# Patient Record
Sex: Female | Born: 1950 | Race: Black or African American | Hispanic: No | Smoking: Never smoker
Health system: Southern US, Community
[De-identification: ages and names within clinical notes are randomized; demographics above are authoritative.]

## PROBLEM LIST (undated history)

## (undated) DIAGNOSIS — M1990 Unspecified osteoarthritis, unspecified site: Secondary | ICD-10-CM

## (undated) DIAGNOSIS — Z8489 Family history of other specified conditions: Secondary | ICD-10-CM

## (undated) DIAGNOSIS — K219 Gastro-esophageal reflux disease without esophagitis: Secondary | ICD-10-CM

## (undated) DIAGNOSIS — I209 Angina pectoris, unspecified: Secondary | ICD-10-CM

## (undated) DIAGNOSIS — Z9889 Other specified postprocedural states: Secondary | ICD-10-CM

## (undated) DIAGNOSIS — I10 Essential (primary) hypertension: Secondary | ICD-10-CM

## (undated) DIAGNOSIS — R112 Nausea with vomiting, unspecified: Secondary | ICD-10-CM

## (undated) DIAGNOSIS — G43909 Migraine, unspecified, not intractable, without status migrainosus: Secondary | ICD-10-CM

## (undated) DIAGNOSIS — Z972 Presence of dental prosthetic device (complete) (partial): Secondary | ICD-10-CM

## (undated) HISTORY — PX: KNEE ARTHROSCOPY AND ARTHROTOMY: SUR84

## (undated) HISTORY — PX: EYE SURGERY: SHX253

## (undated) HISTORY — PX: ABDOMINAL HYSTERECTOMY: SHX81

## (undated) HISTORY — PX: WRIST SURGERY: SHX841

---

## 2003-07-11 ENCOUNTER — Other Ambulatory Visit: Payer: Self-pay

## 2004-01-28 ENCOUNTER — Emergency Department: Payer: Self-pay | Admitting: Emergency Medicine

## 2004-08-01 ENCOUNTER — Emergency Department: Payer: Self-pay | Admitting: Internal Medicine

## 2004-12-25 ENCOUNTER — Emergency Department: Payer: Self-pay | Admitting: Emergency Medicine

## 2005-02-02 ENCOUNTER — Other Ambulatory Visit: Payer: Self-pay

## 2005-02-02 ENCOUNTER — Emergency Department: Payer: Self-pay | Admitting: Emergency Medicine

## 2005-05-04 ENCOUNTER — Emergency Department: Payer: Self-pay | Admitting: Emergency Medicine

## 2005-12-09 ENCOUNTER — Emergency Department: Payer: Self-pay | Admitting: Emergency Medicine

## 2005-12-09 ENCOUNTER — Other Ambulatory Visit: Payer: Self-pay

## 2005-12-28 ENCOUNTER — Other Ambulatory Visit: Payer: Self-pay

## 2005-12-28 ENCOUNTER — Emergency Department: Payer: Self-pay | Admitting: Emergency Medicine

## 2006-05-21 ENCOUNTER — Ambulatory Visit: Payer: Self-pay | Admitting: Family

## 2006-06-10 ENCOUNTER — Ambulatory Visit: Payer: Self-pay | Admitting: Family

## 2006-10-17 ENCOUNTER — Emergency Department: Payer: Self-pay | Admitting: Emergency Medicine

## 2006-10-17 ENCOUNTER — Other Ambulatory Visit: Payer: Self-pay

## 2007-04-05 DIAGNOSIS — I10 Essential (primary) hypertension: Secondary | ICD-10-CM | POA: Insufficient documentation

## 2007-09-15 ENCOUNTER — Inpatient Hospital Stay: Payer: Self-pay | Admitting: Vascular Surgery

## 2008-03-14 ENCOUNTER — Ambulatory Visit: Payer: Self-pay | Admitting: Emergency Medicine

## 2008-03-22 ENCOUNTER — Ambulatory Visit: Payer: Self-pay | Admitting: Emergency Medicine

## 2008-04-16 ENCOUNTER — Emergency Department: Payer: Self-pay | Admitting: Emergency Medicine

## 2008-05-22 DIAGNOSIS — F411 Generalized anxiety disorder: Secondary | ICD-10-CM | POA: Insufficient documentation

## 2008-05-22 DIAGNOSIS — K219 Gastro-esophageal reflux disease without esophagitis: Secondary | ICD-10-CM | POA: Insufficient documentation

## 2008-09-20 ENCOUNTER — Ambulatory Visit: Payer: Self-pay | Admitting: Emergency Medicine

## 2008-11-27 DIAGNOSIS — E669 Obesity, unspecified: Secondary | ICD-10-CM | POA: Insufficient documentation

## 2008-12-01 ENCOUNTER — Emergency Department: Payer: Self-pay | Admitting: Unknown Physician Specialty

## 2009-02-03 ENCOUNTER — Observation Stay: Payer: Self-pay | Admitting: Internal Medicine

## 2009-05-16 ENCOUNTER — Ambulatory Visit: Payer: Self-pay | Admitting: Emergency Medicine

## 2010-05-19 ENCOUNTER — Ambulatory Visit: Payer: Self-pay | Admitting: Family Medicine

## 2010-05-30 ENCOUNTER — Ambulatory Visit: Payer: Self-pay | Admitting: Family Medicine

## 2010-07-03 ENCOUNTER — Encounter: Payer: Self-pay | Admitting: Family Medicine

## 2010-07-13 ENCOUNTER — Encounter: Payer: Self-pay | Admitting: Family Medicine

## 2010-07-30 ENCOUNTER — Encounter: Payer: Self-pay | Admitting: Family Medicine

## 2010-08-12 ENCOUNTER — Encounter: Payer: Self-pay | Admitting: Family Medicine

## 2010-10-14 ENCOUNTER — Ambulatory Visit: Payer: Self-pay | Admitting: Specialist

## 2010-10-14 DIAGNOSIS — I10 Essential (primary) hypertension: Secondary | ICD-10-CM

## 2010-10-22 ENCOUNTER — Ambulatory Visit: Payer: Self-pay | Admitting: Specialist

## 2011-02-08 ENCOUNTER — Emergency Department: Payer: Self-pay | Admitting: Internal Medicine

## 2011-02-25 ENCOUNTER — Inpatient Hospital Stay: Payer: Self-pay | Admitting: Internal Medicine

## 2012-07-01 ENCOUNTER — Emergency Department: Payer: Self-pay | Admitting: Emergency Medicine

## 2012-07-01 LAB — URINALYSIS, COMPLETE
Blood: NEGATIVE
Glucose,UR: NEGATIVE mg/dL (ref 0–75)
Nitrite: NEGATIVE
Ph: 7 (ref 4.5–8.0)
Protein: 100
Specific Gravity: 1.014 (ref 1.003–1.030)
Squamous Epithelial: 2

## 2012-07-01 LAB — CBC
HCT: 39.3 % (ref 35.0–47.0)
MCV: 91 fL (ref 80–100)
Platelet: 223 10*3/uL (ref 150–440)
RDW: 13.4 % (ref 11.5–14.5)
WBC: 6.3 10*3/uL (ref 3.6–11.0)

## 2012-07-01 LAB — COMPREHENSIVE METABOLIC PANEL
Albumin: 3.8 g/dL (ref 3.4–5.0)
Alkaline Phosphatase: 135 U/L (ref 50–136)
BUN: 13 mg/dL (ref 7–18)
Chloride: 104 mmol/L (ref 98–107)
EGFR (African American): >60
Glucose: 107 mg/dL — ABNORMAL HIGH (ref 65–99)
Potassium: 3.9 mmol/L (ref 3.5–5.1)
SGOT(AST): 23 U/L (ref 15–37)
SGPT (ALT): 30 U/L (ref 12–78)
Sodium: 140 mmol/L (ref 136–145)
Total Protein: 8.3 g/dL — ABNORMAL HIGH (ref 6.4–8.2)

## 2012-08-25 DIAGNOSIS — R7303 Prediabetes: Secondary | ICD-10-CM | POA: Insufficient documentation

## 2012-08-26 DIAGNOSIS — E559 Vitamin D deficiency, unspecified: Secondary | ICD-10-CM | POA: Insufficient documentation

## 2012-09-21 ENCOUNTER — Ambulatory Visit: Payer: Self-pay | Admitting: Family Medicine

## 2012-09-28 ENCOUNTER — Observation Stay: Payer: Self-pay | Admitting: Internal Medicine

## 2012-09-28 LAB — COMPREHENSIVE METABOLIC PANEL
Alkaline Phosphatase: 125 U/L (ref 50–136)
BUN: 17 mg/dL (ref 7–18)
Bilirubin,Total: 0.4 mg/dL (ref 0.2–1.0)
Calcium, Total: 9.1 mg/dL (ref 8.5–10.1)
Creatinine: 0.91 mg/dL (ref 0.60–1.30)
EGFR (African American): >60
EGFR (Non-African Amer.): >60
Glucose: 149 mg/dL — ABNORMAL HIGH (ref 65–99)
Osmolality: 278 (ref 275–301)
SGOT(AST): 22 U/L (ref 15–37)
SGPT (ALT): 21 U/L (ref 12–78)

## 2012-09-28 LAB — CBC
HCT: 38.7 % (ref 35.0–47.0)
HGB: 13.1 g/dL (ref 12.0–16.0)
MCHC: 33.8 g/dL (ref 32.0–36.0)
MCV: 87 fL (ref 80–100)
RBC: 4.43 10*6/uL (ref 3.80–5.20)

## 2012-09-28 LAB — URINALYSIS, COMPLETE
Bilirubin,UR: NEGATIVE
Blood: NEGATIVE
Glucose,UR: NEGATIVE mg/dL (ref 0–75)
Ketone: NEGATIVE
Leukocyte Esterase: NEGATIVE
Nitrite: NEGATIVE
Ph: 7 (ref 4.5–8.0)
RBC,UR: <1 /HPF (ref 0–5)
Squamous Epithelial: 3
WBC UR: 2 /HPF (ref 0–5)

## 2012-09-28 LAB — TROPONIN I
Troponin-I: <0.02 ng/mL
Troponin-I: <0.02 ng/mL
Troponin-I: <0.02 ng/mL

## 2012-09-28 LAB — CK TOTAL AND CKMB (NOT AT ARMC)
CK, Total: 91 U/L (ref 21–215)
CK, Total: 91 U/L (ref 21–215)
CK-MB: 1.8 ng/mL (ref 0.5–3.6)
CK-MB: 1.3 ng/mL (ref 0.5–3.6)

## 2012-09-29 LAB — BASIC METABOLIC PANEL
Anion Gap: 4 — ABNORMAL LOW (ref 7–16)
BUN: 12 mg/dL (ref 7–18)
Creatinine: 0.9 mg/dL (ref 0.60–1.30)
EGFR (African American): >60
EGFR (Non-African Amer.): >60
Glucose: 99 mg/dL (ref 65–99)
Osmolality: 283 (ref 275–301)
Potassium: 3.9 mmol/L (ref 3.5–5.1)
Sodium: 142 mmol/L (ref 136–145)

## 2012-09-29 LAB — LIPID PANEL
Cholesterol: 169 mg/dL (ref 0–200)
Triglycerides: 110 mg/dL (ref 0–200)
VLDL Cholesterol, Calc: 22 mg/dL (ref 5–40)

## 2012-12-12 ENCOUNTER — Emergency Department: Payer: Self-pay | Admitting: Emergency Medicine

## 2013-09-12 DIAGNOSIS — M1710 Unilateral primary osteoarthritis, unspecified knee: Secondary | ICD-10-CM | POA: Insufficient documentation

## 2013-09-26 ENCOUNTER — Other Ambulatory Visit: Payer: Self-pay | Admitting: Orthopedic Surgery

## 2013-09-26 LAB — SYNOVIAL CELL COUNT + DIFF, W/ CRYSTALS
BASOS ABS: 0 %
Eosinophil: 0 %
Lymphocytes: 1 %
Neutrophils: 96 %
Nucleated Cell Count: 10692 /mm3
OTHER CELLS BF: 0 %
Other Mononuclear Cells: 3 %

## 2013-09-30 LAB — BODY FLUID CULTURE

## 2013-10-06 ENCOUNTER — Ambulatory Visit: Payer: Self-pay | Admitting: Orthopedic Surgery

## 2013-10-20 ENCOUNTER — Ambulatory Visit: Payer: Self-pay | Admitting: Orthopedic Surgery

## 2013-11-01 ENCOUNTER — Ambulatory Visit: Payer: Self-pay | Admitting: Orthopedic Surgery

## 2013-11-01 LAB — APTT: Activated PTT: 30.3 secs (ref 23.6–35.9)

## 2013-11-01 LAB — CBC
HCT: 38.8 % (ref 35.0–47.0)
HGB: 12.5 g/dL (ref 12.0–16.0)
MCH: 28.9 pg (ref 26.0–34.0)
MCHC: 32.2 g/dL (ref 32.0–36.0)
MCV: 90 fL (ref 80–100)
PLATELETS: 219 10*3/uL (ref 150–440)
RBC: 4.33 10*6/uL (ref 3.80–5.20)
RDW: 13.3 % (ref 11.5–14.5)
WBC: 7.1 10*3/uL (ref 3.6–11.0)

## 2013-11-01 LAB — BASIC METABOLIC PANEL
ANION GAP: 3 — AB (ref 7–16)
BUN: 13 mg/dL (ref 7–18)
Calcium, Total: 9.1 mg/dL (ref 8.5–10.1)
Chloride: 105 mmol/L (ref 98–107)
Co2: 30 mmol/L (ref 21–32)
Creatinine: 0.98 mg/dL (ref 0.60–1.30)
EGFR (African American): >60
EGFR (Non-African Amer.): >60
GLUCOSE: 114 mg/dL — AB (ref 65–99)
Osmolality: 277 (ref 275–301)
Potassium: 3.4 mmol/L — ABNORMAL LOW (ref 3.5–5.1)
Sodium: 138 mmol/L (ref 136–145)

## 2013-11-01 LAB — URINALYSIS, COMPLETE
Bacteria: NONE SEEN
Bilirubin,UR: NEGATIVE
Blood: NEGATIVE
GLUCOSE, UR: NEGATIVE mg/dL (ref 0–75)
Ketone: NEGATIVE
Leukocyte Esterase: NEGATIVE
Nitrite: NEGATIVE
Ph: 5 (ref 4.5–8.0)
Protein: NEGATIVE
RBC,UR: <1 /HPF (ref 0–5)
SPECIFIC GRAVITY: 1.012 (ref 1.003–1.030)
Squamous Epithelial: 1
WBC UR: <1 /HPF (ref 0–5)

## 2013-11-01 LAB — SEDIMENTATION RATE: ERYTHROCYTE SED RATE: 14 mm/h (ref 0–30)

## 2013-11-01 LAB — PROTIME-INR
INR: 1
PROTHROMBIN TIME: 12.6 s (ref 11.5–14.7)

## 2013-11-01 LAB — MRSA PCR SCREENING

## 2013-11-14 ENCOUNTER — Inpatient Hospital Stay: Payer: Self-pay | Admitting: Orthopedic Surgery

## 2013-11-15 LAB — BASIC METABOLIC PANEL
Anion Gap: 7 (ref 7–16)
BUN: 14 mg/dL (ref 7–18)
CREATININE: 1.06 mg/dL (ref 0.60–1.30)
Calcium, Total: 7.7 mg/dL — ABNORMAL LOW (ref 8.5–10.1)
Chloride: 105 mmol/L (ref 98–107)
Co2: 26 mmol/L (ref 21–32)
GFR CALC NON AF AMER: 56 — AB
Glucose: 126 mg/dL — ABNORMAL HIGH (ref 65–99)
Osmolality: 278 (ref 275–301)
Potassium: 4.1 mmol/L (ref 3.5–5.1)
Sodium: 138 mmol/L (ref 136–145)

## 2013-11-15 LAB — PLATELET COUNT: Platelet: 191 10*3/uL (ref 150–440)

## 2013-11-15 LAB — HEMOGLOBIN: HGB: 11.5 g/dL — ABNORMAL LOW (ref 12.0–16.0)

## 2013-11-16 LAB — BASIC METABOLIC PANEL
ANION GAP: 4 — AB (ref 7–16)
BUN: 12 mg/dL (ref 7–18)
CO2: 28 mmol/L (ref 21–32)
CREATININE: 1.07 mg/dL (ref 0.60–1.30)
Calcium, Total: 7.9 mg/dL — ABNORMAL LOW (ref 8.5–10.1)
Chloride: 104 mmol/L (ref 98–107)
EGFR (African American): >60
GFR CALC NON AF AMER: 55 — AB
Glucose: 125 mg/dL — ABNORMAL HIGH (ref 65–99)
Osmolality: 273 (ref 275–301)
Potassium: 4.3 mmol/L (ref 3.5–5.1)
SODIUM: 136 mmol/L (ref 136–145)

## 2013-11-16 LAB — HEMOGLOBIN: HGB: 10.3 g/dL — ABNORMAL LOW (ref 12.0–16.0)

## 2013-11-16 LAB — PATHOLOGY REPORT

## 2013-11-17 ENCOUNTER — Encounter: Payer: Self-pay | Admitting: Internal Medicine

## 2013-11-17 LAB — HEMOGLOBIN: HGB: 9.3 g/dL — ABNORMAL LOW (ref 12.0–16.0)

## 2013-11-18 LAB — URINALYSIS, COMPLETE
Bacteria: NONE SEEN
Bilirubin,UR: NEGATIVE
Blood: NEGATIVE
Glucose,UR: NEGATIVE mg/dL (ref 0–75)
Ketone: NEGATIVE
Leukocyte Esterase: NEGATIVE
Nitrite: NEGATIVE
Ph: 6 (ref 4.5–8.0)
Protein: NEGATIVE
RBC,UR: NONE SEEN /HPF (ref 0–5)
Specific Gravity: 1.004 (ref 1.003–1.030)
Squamous Epithelial: <1
WBC UR: NONE SEEN /HPF (ref 0–5)

## 2013-11-20 LAB — URINE CULTURE

## 2013-11-23 LAB — URINALYSIS, COMPLETE
BILIRUBIN, UR: NEGATIVE
Bacteria: NONE SEEN
Blood: NEGATIVE
GLUCOSE, UR: NEGATIVE mg/dL (ref 0–75)
KETONE: NEGATIVE
LEUKOCYTE ESTERASE: NEGATIVE
Nitrite: NEGATIVE
PH: 6 (ref 4.5–8.0)
Protein: NEGATIVE
Specific Gravity: 1.008 (ref 1.003–1.030)
Squamous Epithelial: 8
WBC UR: 1 /HPF (ref 0–5)

## 2013-11-25 LAB — URINE CULTURE

## 2013-12-14 ENCOUNTER — Ambulatory Visit: Payer: Self-pay | Admitting: Orthopedic Surgery

## 2013-12-20 DIAGNOSIS — Z9889 Other specified postprocedural states: Secondary | ICD-10-CM | POA: Insufficient documentation

## 2014-08-03 NOTE — Discharge Summary (Signed)
PATIENT NAME:  [name], [name] MR#:  [id number] DATE OF BIRTH:  May 14, 1950  DATE OF ADMISSION:  09/28/2012 DATE OF DISCHARGE:  09/29/2012  ADMISSION DIAGNOSIS: Possible cerebrovascular accident.   DISCHARGE DIAGNOSES: 1.  Lightheaded and nausea possibly secondary to HCTZ versus viral etiology or dehydration.  2.  Malignant hypertension.  3.  Gastroesophageal reflux disease.  4.  Anxiety.   CONSULTANTS: None.   LABORATORY AND DIAGNOSTICS:  At discharge: Sodium 142, potassium 3.9, chloride 109, bicarb 29, BUN 12, creatinine 0.90, glucose 99, LDL 99, VLDL 22, cholesterol 169 and HDL 48. Troponin x 3 were negative.   MRI of the brain showed no acute intracranial hemorrhage or CVA. A 2-D echocardiogram showed a normal ejection fraction of 60% to 65% with mild aortic regurgitation, impaired relaxation LV diastolic dysfunction.   Carotid ultrasound was negative.   HOSPITAL COURSE: This is a 64 year old female who presented with some very mild right-sided weakness, malignant hypertension and lightheadedness. Initially thought to possibly be due to a stroke, For further details, please refer to the H and P.  1.  Lightheadedness with nausea.  Initially thought this could be a TIA or CVA as the patient did have some very minimal right-sided weakness on admission. I think this is actually due to her high blood pressure. She felt that her nausea and lightheadedness came after her primary care physician increased her lisinopril/HCTZ dose. Her MRI is normal. Dopplers are normal. Neurological exam is unremarkable.  As mentioned, her right upper extremity very mild weakness was likely due to accelerated hypertension. Her symptoms have improved.  Will DC the HCTZ and increase the lisinopril.  2.  Malignant hypertension. Improved. The patient will be DC'd on lisinopril as the patient thinks her increase in hypertension meds caused these symptoms and more likely will not take her medications at home as she is on  lisinopril/HCTZ.  3.  GERD.  On PPI.  4.  Anxiety. I wonder if some anxiety is playing a component in her symptoms. She is on bupropion at home. This can be just outpatient as an outpatient by her PCP.   DISCHARGE MEDICATIONS: 1.  Omeprazole 20 mg b.i.d.  2.  Multivitamin 1 tablet daily.  3.  Bupropion 5 mg b.i.d. p.r.n.  4.  Lisinopril 40 mg daily.   DISCHARGE DIET:  Low sodium.   DISCHARGE ACTIVITY: As tolerated.  DISCHARGE FOLLOWUP:  The patient will follow up with [facility] in 3 to 4 days.  The patient is medically stable for discharge.   TIME SPENT:  Approximately 35 minutes.  ____________________________ Donell Beers. Benjie Karvonen, MD spm:sb D: 09/29/2012 12:59:08 ET T: 09/29/2012 14:08:14 ET JOB#: [id number]  cc: Katheren Jimmerson P. Benjie Karvonen, MD, <Dictator> [place] P Salvatore Shear MD ELECTRONICALLY SIGNED 09/29/2012 21:54

## 2014-08-03 NOTE — H&P (Signed)
PATIENT NAME:  [name], [name] MR#:  [id number] DATE OF BIRTH:  03-13-1951  DATE OF ADMISSION:  09/28/2012  PRIMARY CARE PHYSICIAN:  [facility].  CHIEF COMPLAINT:  Dizziness/lightheadedness and nausea.   HISTORY OF PRESENT ILLNESS:  This is a 64 year old female who presents with the above complaint. Over the past 2 days, the patient says that she has not been feeling herself. Her primary care doctor increased her HCTZ/lisinopril on June 10th, and after taking a couple of these pills, she said that she has felt just not herself, very lightheaded. However, today, she felt significantly lightheaded and dizzy. She was unable to even get off the bed, and she had persistent nausea and vomiting. She does not notice any kind of gait instability. In the ER, it is noticed that her right hand is much weaker than the left, and she does say that it feels numb.     REVIEW OF SYSTEMS:   CONSTITUTIONAL: No fever. Positive fatigue, weakness.  EYES:  No blurred or double vision. No glaucoma or cataracts.  EARS, NOSE, THROAT:  No ear pain.  No snoring or postnasal drip.  RESPIRATORY: No cough, wheezing, hemoptysis, COPD or dyspnea.  CARDIOVASCULAR: No chest pain, orthopnea, palpitations, syncope, edema, arrhythmia, dyspnea on exertion.  GASTROINTESTINAL:  Positive nausea, vomiting. No diarrhea, abdominal pain, melena or ulcers.  GENITOURINARY:   No dysuria or hematuria.  ENDOCRINE:  No polyuria or polydipsia.  HEMATOLOGIC AND LYMPHATICS:  No anemia or easy bruising. SKIN: No rashes or lesions.  MUSCULOSKELETAL: No pain in the shoulders or knees. No limited activity.  NEUROLOGIC:  No history of CVA, TIA, or seizures.  PSYCHIATRIC:  Positive anxiety.   PAST MEDICAL HISTORY: 1.  Hypertension.  2.  Migraines.  3.  Some anxiety.  4.  GERD.   MEDICATIONS:   1.  Omeprazole 20 mg b.i.d.  2.  Multivitamin 1 tablet daily.  3.  HCTZ/lisinopril 25/20, 1 tablet daily.  4.  Buspirone 5 mg.   ALLERGIES: TAPE,  CODEINE, PAXIL AND ASPIRIN, WHICH CAUSES HIVES.   PAST SURGICAL HISTORY:  1.  Cholecystectomy.  2.  Appendectomy.   3.  Wrist surgery. 4.  Hysterectomy.  5.  C-section.   FAMILY HISTORY:  Positive for CAD and father had cancer.   SOCIAL HISTORY:   No tobacco, alcohol or drug use.   PHYSICAL EXAMINATION: VITAL SIGNS:  Temperature 97.8, pulse 78, respirations 20, blood pressure 184/77, 97% on room air.  GENERAL:  The patient is alert, oriented. She is very teary.  HEENT:  Head is atraumatic. Pupils are round, reactive. Sclerae anicteric. Mucous membranes are moist.   Oropharynx is clear.  NECK:  Supple without JVD, carotid bruit, enlarged thyroid.  CARDIOVASCULAR: Regular rate and rhythm. No murmurs, gallops or rubs. PMI is not displaced.  LUNGS:  Clear to auscultation bilaterally without crackles, rales, rhonchi or wheezing. Normal percussion.  ABDOMEN: Bowel sounds are positive. Nontender, nondistended. No hepatosplenomegaly. No rebound or guarding.  EXTREMITIES:  No clubbing, cyanosis or edema.  NEUROLOGIC:  Cranial nerves II through XII are intact. There are no focal deficits. Sensation is decreased in the right upper arm and right face, as compared to the left.  DTRs are 2+ bilaterally and symmetrically.  Her strength in the right side is a 4/5 in her upper extremity. Left upper extremity and lower extremities are 5/5.  SKIN:  Without rash or lesions.   DIAGNOSTIC, LABORATORY AND RADIOLOGICAL DATA: Sodium 137, potassium 4.6, chloride 105, bicarb 25, BUN 17,  creatinine 0.91, glucose 149, total protein 7.8, albumin 3.8, bilirubin 0.4 , alk phos 125, AST 22, ALT 21. Troponin less than 0.02. White blood cells 7.8, hemoglobin 13, hematocrit 38.7, platelets are 215. CT of the head shows no acute intracranial hemorrhage or CVA.   EKG: Normal sinus rhythm. No ST elevations or depressions.   ASSESSMENT AND PLAN:  A 64 year old female who presents with feeling dizzy with nausea and vomiting and  right-sided weakness.  1.  Cerebrovascular accident.  The patient will be admitted to telemetry to evaluate for cerebrovascular event, especially in the posterior lobe, given her nausea, vomiting and dizziness. MRI has been ordered, carotid Dopplers and echocardiogram. SHE IS ALLERGIC TO ASPIRIN, so I have started Plavix and statin. Lipid panel will also be checked. We will consult PT, OT and case management.  2.  Malignant hypertension, possibly secondary to #1.   Due to the possibility of problem #1, we will go head and hold her lisinopril/HCTZ and use hydralazine with p.r.n. parameters for systolic blood pressure greater than 190 to allow perfusion to the brain.  3.  Gastroesophageal reflux disease. We will continue PPI.  4.  Anxiety. Continue buspirone.      CODE STATUS:  The patient is full CODE STATUS.   TIME SPENT: Approximately 50 minutes.    ____________________________ Donell Beers. Benjie Karvonen, MD spm:dmm D: 09/28/2012 09:11:29 ET T: 09/28/2012 09:29:32 ET JOB#: [id number]  cc: Jacobie Stamey P. Benjie Karvonen, MD, <Dictator> [place] P Kandas Oliveto MD ELECTRONICALLY SIGNED 09/28/2012 14:52

## 2014-08-04 NOTE — Discharge Summary (Signed)
PATIENT NAMESIBEL, Church MR#:  [id number] DATE OF BIRTH:  1950/11/07  DATE OF ADMISSION:  11/14/2013 DATE OF DISCHARGE:  11/17/2013  ADMITTING DIAGNOSIS: Left knee osteoarthritis.   DISCHARGE DIAGNOSIS: Left knee osteoarthritis.   OPERATION: On 11/14/2013 the patient had a left total knee arthroplasty.   SURGEON: Hessie Knows, M.D.   ASSISTANT: Rachelle Hora.   ANESTHESIA: Spinal.  IMPLANTS USED: Medacta GMK sphere system with a left 3 femur, fixed left tibial baseplate with a 10 mm flex insert, and a size 2 patella.   COMPLICATIONS: None. The patient was stabilized, brought to the orthopedic floor and then brought down to the recovery room.   HISTORY OF PRESENT ILLNESS: The patient is a 64 year old female who presented for severe left knee osteoarthritis. The patient has had symptoms for over year and has been refractory to conservative treatment. The patient has tried Synvisc and cortisone injections with activity modifications.   PHYSICAL EXAMINATION: In general, alert female with no significant discomfort. The patient is ambulating with difficulty involving the left lower extremity. The patient has range of motion of 100 degrees flexion to about minus 10 degrees extension. The patient has full strength. The patient does have discomfort along the medial lateral joint line with positive crepitus and retropatellar discomfort.   HOSPITAL COURSE: After initial admission on November 14, 2013, the patient was brought to the orthopedic floor. On postop day 1, the patient had a hemoglobin of 11.5, which dropped down to around 10.3 on postop day 2. The patient did physical therapy, initially bed to chair and progressed up to ambulating about 25 feet with physical therapy. The patient was having some wheezing and a little bit of coughing. The patient did have a chest x-ray before discharge.   CONDITION AT DISCHARGE: Stable.   DISPOSITION: The patient was sent to rehab.   DISCHARGE INSTRUCTIONS:  The patient will follow up at [facility] in 2 weeks. The patient will do weight bear as tolerated on the effected leg. The patient will elevate her left leg with 1 to 2 pillows and use thigh-high TED hose on both legs. The patient will do incentive spirometer every hour while awake and be encouraged to do cough and deep breathing. The patient will do a regular diet. The patient will use a Polar Care to decrease swelling. The patient will keep her dressing on, try not to get it wet or dirty. The patient will have a dressing change on a p.r.n. basis. The patient or rehab will call the clinic if there is any bright red bleeding, calf pain, bowel or bladder difficulty, or any fever greater than 101.5. The patient will do physical therapy and occupational therapy per protocol. The patient will continue her nasal cannula while her oxygen saturation is below 90 and will discontinue as it improves.   DISCHARGE MEDICATIONS: Prilosec 20 mg 1 capsule b.i.d., lisinopril 40 mg 1 tablet daily, hydrochlorothiazide 25 mg 1 tablet daily, cetirizine 10 mg 1 tablet daily, vitamin D3 1000 international units 1 capsule daily, vitamin B complex 1 tablet daily, amlodipine 5 mg 1 tablet daily, Tylenol 500 mg 1 tablet q. 4 hours as needed for pain or fever greater than 100.4, oxycodone 10 mg 1 tablet q. 12 hours extended release, Nucynta 75 mg 1 tablet q. 4 hours as needed for moderate pain, milk of magnesia 30 mL 2 times a day, Xarelto 10 mg 1 tablet daily for 10 days and then discontinue, Zofran 4 mg 1 tablet q. 4  to 6 hours as needed for nausea, bisacodyl 10 mg per rectally daily as needed for constipation.  ____________________________ Lenna Sciara. Reche Dixon, [place] jtm:sb D: 11/17/2013 07:33:54 ET T: 11/17/2013 08:00:57 ET JOB#: [id number]  cc: J. Reche Dixon, [place], <Dictator> J Amiylah Anastos [facility] ELECTRONICALLY SIGNED 11/20/2013 8:07

## 2014-08-04 NOTE — Op Note (Signed)
PATIENT NAMEZANYA, [name] MR#:  [id number] DATE OF BIRTH:  1950-08-05  DATE OF PROCEDURE:  11/14/2013  PREOPERATIVE DIAGNOSIS: Left knee osteoarthritis.   POSTOPERATIVE DIAGNOSIS: Left knee osteoarthritis.    PROCEDURE: Left total knee replacement.   SURGEON: Laurene Footman, M.D.   ASSISTANT: Rachelle Hora, PA-C.   ANESTHESIA: Spinal.   DESCRIPTION OF PROCEDURE: The patient was brought to the operating room, and after adequate anesthesia was obtained, the left leg was prepped and draped in the usual sterile fashion with a tourniquet applied to the upper thigh. The patient identification and timeout procedures were utilized. The leg was exsanguinated with an Esmarch tourniquet raised to 300 mmHg. A midline skin incision was made, followed by a medial parapatellar arthrotomy. Inspection of the knee revealed significant tricompartmental osteoarthritis, the medial compartment being the most affected. The ACL was excised along with the fat pad and the PCL. The anterior horns of the menisci were also excised. The proximal tibia was exposed, and for application of the Medacta cutting block to the tibia, this was applied, the longitudinal alignment checked, and the proximal tibial cut carried out with the bone removed as 1 block. Next, the femur was denuded of cartilage in the areas where the Medacta femoral cutting block was to rest, the cutting guide applied, and distal femoral cut carried out with pinholes placed distally. The size 3 femoral cutting block was applied; anterior, posterior and chamfer cuts carried out. The tibia was between a 2 or 3 and the 3 gave excellent coverage and was chosen for the final implant. The trial was pinned in place and a central drill hole carried out followed by the keel punch. Trials were placed with a 10 mm insert. The medial side was very stable. The distal femoral drill holes were made followed by the notch cut, the slot cut for the trochlear groove. These trials  were all removed and the patella was cut using the patellar cutting guide, resecting 10 mm. Three drill holes were made and sized to a size 2.   The local anesthetic was infiltrated at this time with Exparel diluted with saline, injected periarticularly, as was a combination of Toradol and morphine. The tourniquet was let down and there was no significant bleeding. The tourniquet was raised and the bony surfaces thoroughly irrigated and dried. The tibial component was cemented into place first with cement. Excess cement removed, tibial insert locked into place, followed by the femoral component. The knee was held in extension and the patellar button clamped into place.   At this point, after the cement had set and the set screw was placed in the poly, the knee was thoroughly irrigated. Excess cement removed. A lateral release was required as there is some lateral subluxation of the patella. The arthrotomy was repaired using a heavy quill with some extra Ethibond to make sure to maintain the patella in appropriate position. The knee was stable in full extension and flexion with excellent stability.   The skin was closed with 2-0 subcutaneous quill followed by skin staples. Xeroform, 4 x 4's, ABD, Webril, Polar Care and Ace wrap. The patient was sent to recovery in stable condition.   ESTIMATED BLOOD LOSS: 50.   COMPLICATIONS: None.   SPECIMEN: Cut ends of bone.  IMPLANTS: Medacta GMK sphere system with a left 3 femur, fixed left tibia baseplate with a 10 mm flex insert, and a size 2 patella.   COMPLICATIONS: None.    ____________________________ Laurene Footman, MD mjm:jr  D: 11/14/2013 13:24:21 ET T: 11/14/2013 13:43:53 ET JOB#: [id number]  cc: Laurene Footman, MD, <Dictator> Laurene Footman MD ELECTRONICALLY SIGNED 11/14/2013 14:59

## 2014-08-04 NOTE — Op Note (Signed)
PATIENT NAMEASUNA, [name]#:  [id number] DATE OF BIRTH:  Sep 04, 1950  DATE OF PROCEDURE:  12/14/2013   PREOPERATIVE DIAGNOSIS: Left knee arthrofibrosis, post total knee.   POSTOPERATIVE DIAGNOSIS: Left knee arthrofibrosis, post total knee.   PROCEDURE: Left knee manipulation.   ANESTHESIA: General.   SURGEON: Laurene Footman, M.D.   DESCRIPTION OF PROCEDURE: The patient was brought to the operating room and, after adequate anesthesia was obtained,  appropriate patient identification and timeout procedures were completed. With the leg held up and flexion at the hip, the knee was at approximately 70 to 75 degrees of flexion and lacked approximately 15 degrees of extension. Gentle pressure was then applied to the proximal tibia, and there was palpable and audible popping of adhesions. Flexion was brought back to 125 degrees. Bringing the leg out into extension, full extension was possible.   The patient was then sent to the recovery room in stable condition.   BLOOD LOSS: None.   SPECIMEN: None.   COMPLICATIONS: None.    ____________________________ Laurene Footman, MD mjm:MT D: 12/14/2013 19:49:41 ET T: 12/15/2013 06:57:48 ET JOB#: [id number]  cc: Laurene Footman, MD, <Dictator> Laurene Footman MD ELECTRONICALLY SIGNED 12/15/2013 8:09

## 2015-02-01 IMAGING — CT CT OF THE LEFT KNEE WITHOUT CONTRAST
3 of 8 series · 11 of 33 positions shown, 13 images · non-contrast
Comparison: MRI dated 10/06/2013

CLINICAL DATA: Progressive left knee pain. Preoperative planning.
Osteoarthritis.

EXAM:
CT OF THE LEFT KNEE WITHOUT CONTRAST
TECHNIQUE: Multidetector CT imaging of the left knee was performed according to
the standard protocol. Multiplanar CT image reconstructions were
also generated.

[Series 4: knee · axial · 0.39mm/px · z∈[+384,+569]mm · 5 of 279 slices shown, 7 images]
[im 47/279  soft-tissue]
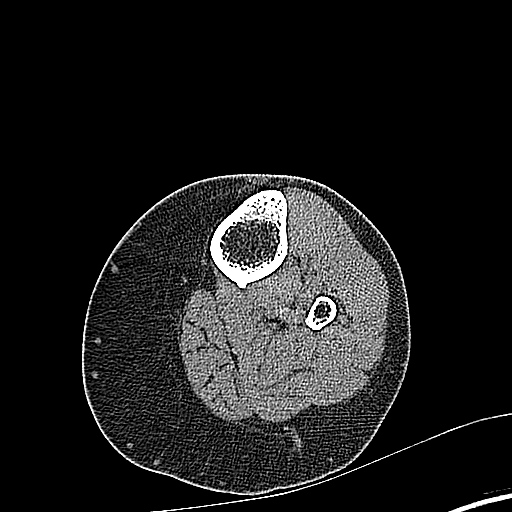
[im 47/279  bone]
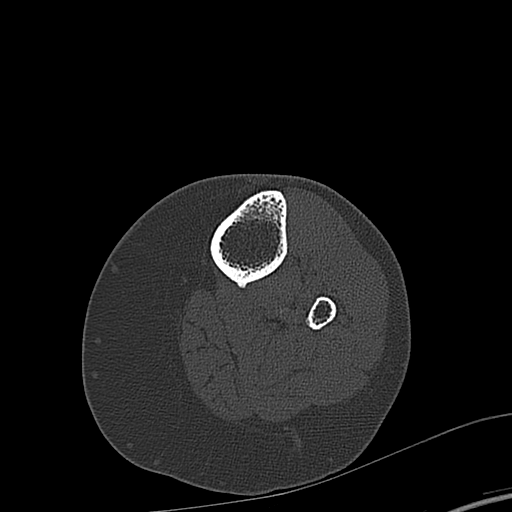
[im 93/279  bone]
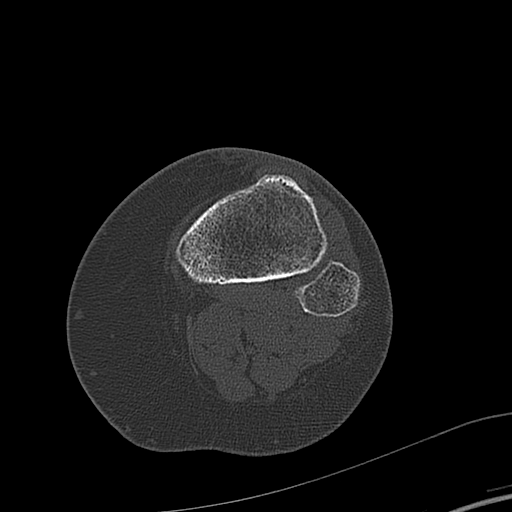
[im 140/279  bone]
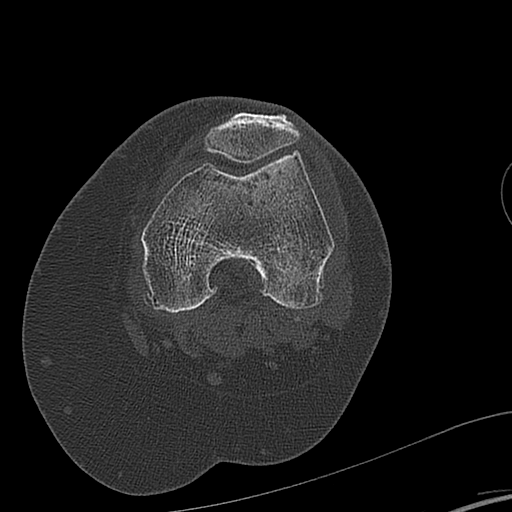
[im 186/279  bone]
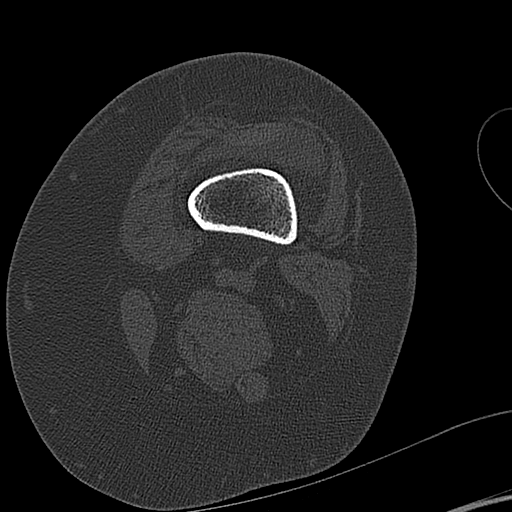
[im 232/279  soft-tissue]
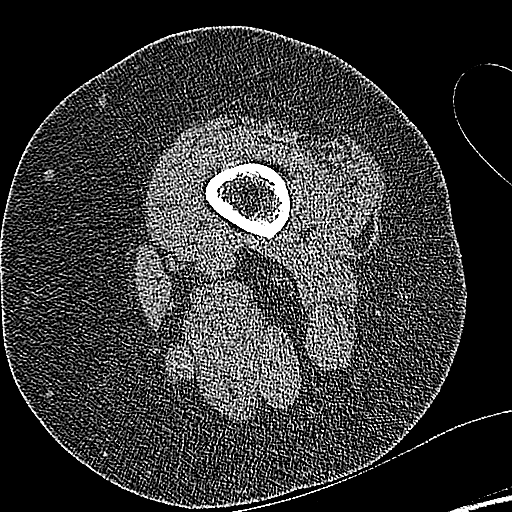
[im 232/279  bone]
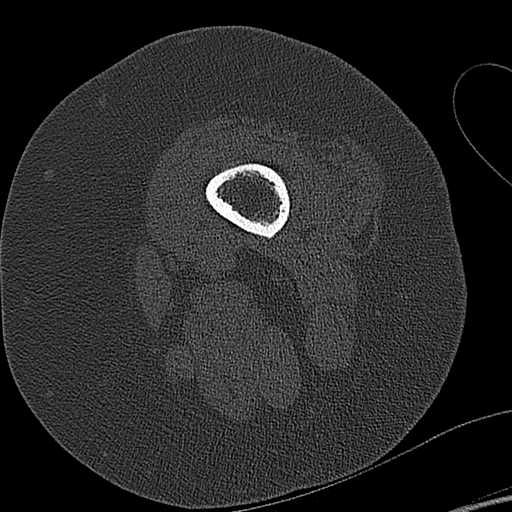

[Series 602: sagittal bone · sagittal · 0.55mm/px · 5 of 48 slices shown]
[im 8/48  bone]
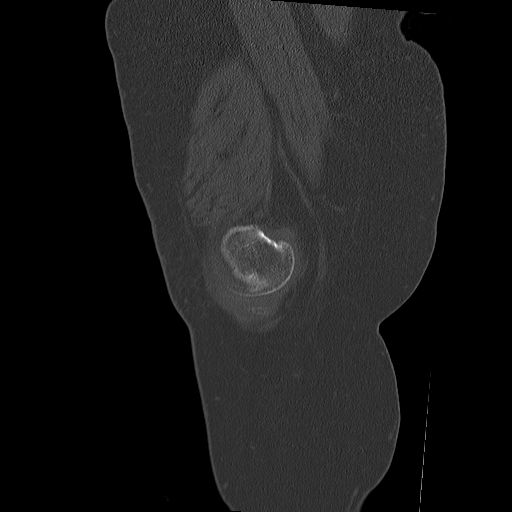
[im 16/48  bone]
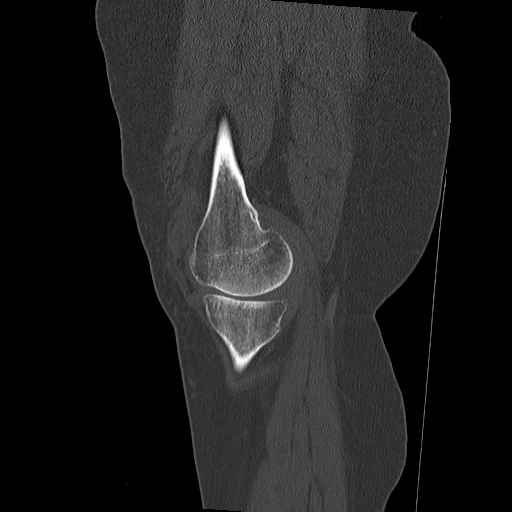
[im 24/48  bone]
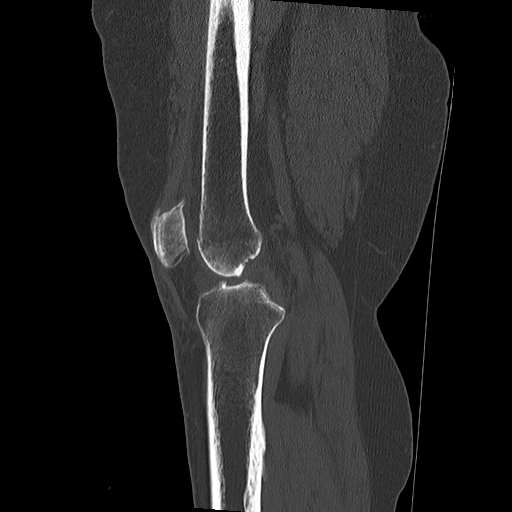
[im 32/48  bone]
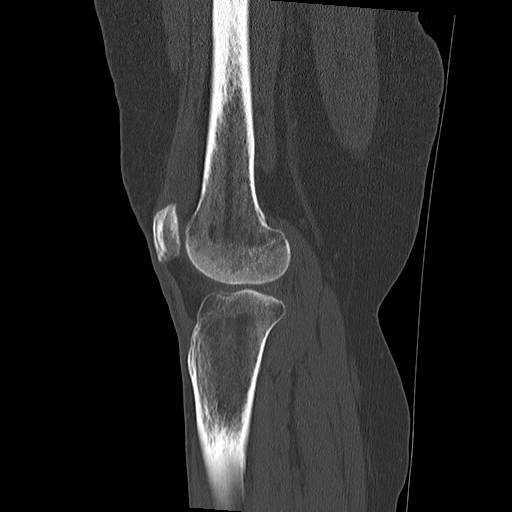
[im 40/48  bone]
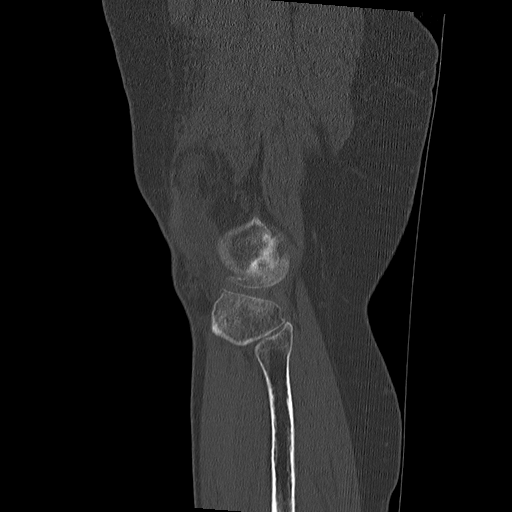

[Series 603: coronal bone · coronal · 0.55mm/px · 1 of 41 slices shown]
[im 21/41  bone]
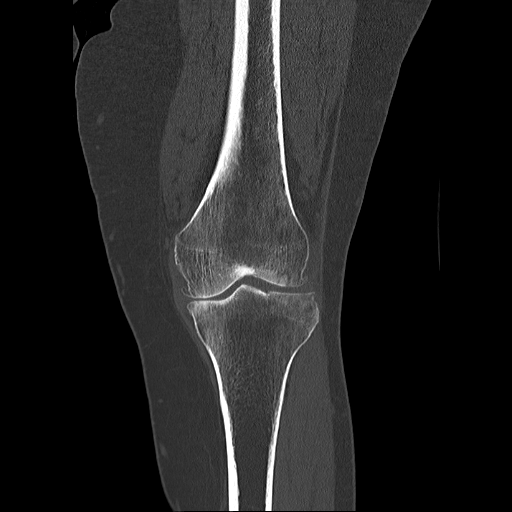

[11 of 33 positions shown; findings below may reference images not displayed]

FINDINGS: Images were obtained through the hip, knee, and ankle for
preoperative planning. There is severe osteoarthritis of the medial
compartment of the left knee with less severe arthritis of the
patellofemoral and lateral compartments. There is medial joint space
narrowing. There is a moderate joint effusion. No calcified loose
bodies in the joint.
IMPRESSION: Tricompartmental osteoarthritis of the left knee.

## 2015-02-21 ENCOUNTER — Encounter: Payer: Self-pay | Admitting: *Deleted

## 2015-02-22 ENCOUNTER — Ambulatory Visit: Payer: Medicare Other | Admitting: Anesthesiology

## 2015-02-22 ENCOUNTER — Ambulatory Visit
Admission: RE | Admit: 2015-02-22 | Discharge: 2015-02-22 | Disposition: A | Discharge status: Home / Self Care | Payer: Medicare Other | Source: Ambulatory Visit | Attending: Gastroenterology | Admitting: Gastroenterology

## 2015-02-22 ENCOUNTER — Encounter
Admission: RE | Disposition: A | Discharge status: Home / Self Care | Payer: Self-pay | Source: Ambulatory Visit | Attending: Gastroenterology

## 2015-02-22 ENCOUNTER — Encounter: Payer: Self-pay | Admitting: *Deleted

## 2015-02-22 DIAGNOSIS — K5730 Diverticulosis of large intestine without perforation or abscess without bleeding: Secondary | ICD-10-CM | POA: Diagnosis not present

## 2015-02-22 DIAGNOSIS — Z6836 Body mass index (BMI) 36.0-36.9, adult: Secondary | ICD-10-CM | POA: Diagnosis not present

## 2015-02-22 DIAGNOSIS — Z79899 Other long term (current) drug therapy: Secondary | ICD-10-CM | POA: Diagnosis not present

## 2015-02-22 DIAGNOSIS — Z1211 Encounter for screening for malignant neoplasm of colon: Secondary | ICD-10-CM | POA: Insufficient documentation

## 2015-02-22 DIAGNOSIS — K219 Gastro-esophageal reflux disease without esophagitis: Secondary | ICD-10-CM | POA: Insufficient documentation

## 2015-02-22 DIAGNOSIS — Z800 Family history of malignant neoplasm of digestive organs: Secondary | ICD-10-CM | POA: Diagnosis not present

## 2015-02-22 DIAGNOSIS — I10 Essential (primary) hypertension: Secondary | ICD-10-CM | POA: Insufficient documentation

## 2015-02-22 DIAGNOSIS — E669 Obesity, unspecified: Secondary | ICD-10-CM | POA: Diagnosis not present

## 2015-02-22 HISTORY — DX: Angina pectoris, unspecified: I20.9

## 2015-02-22 HISTORY — DX: Essential (primary) hypertension: I10

## 2015-02-22 HISTORY — PX: COLONOSCOPY: SHX5424

## 2015-02-22 HISTORY — DX: Gastro-esophageal reflux disease without esophagitis: K21.9

## 2015-02-22 SURGERY — COLONOSCOPY
Anesthesia: General

## 2015-02-22 MED ORDER — PROPOFOL 10 MG/ML IV BOLUS
INTRAVENOUS | Status: DC | PRN
  Administered 2015-02-22: 40 mg via INTRAVENOUS
  Administered 2015-02-22: 30 mg via INTRAVENOUS

## 2015-02-22 MED ORDER — SODIUM CHLORIDE 0.9 % IV SOLN
INTRAVENOUS | Status: DC
  Administered 2015-02-22 (×2): via INTRAVENOUS

## 2015-02-22 MED ORDER — PROPOFOL 500 MG/50ML IV EMUL
INTRAVENOUS | Status: DC | PRN
  Administered 2015-02-22: 125 ug/kg/min via INTRAVENOUS

## 2015-02-22 MED ORDER — FENTANYL CITRATE (PF) 100 MCG/2ML IJ SOLN
INTRAMUSCULAR | Status: DC | PRN
  Administered 2015-02-22: 50 ug via INTRAVENOUS

## 2015-02-22 MED ORDER — MIDAZOLAM HCL 2 MG/2ML IJ SOLN
INTRAMUSCULAR | Status: DC | PRN
  Administered 2015-02-22: 1 mg via INTRAVENOUS

## 2015-02-22 NOTE — Anesthesia Preprocedure Evaluation (Signed)
Anesthesia Evaluation  Patient identified by MRN, date of birth, ID band Patient awake    Reviewed: Allergy & Precautions, NPO status , Patient's Chart, lab work & pertinent test results  Airway Mallampati: II       Dental  (+) Upper Dentures   Pulmonary neg pulmonary ROS,    breath sounds clear to auscultation       Cardiovascular hypertension, Pt. on medications + angina  Rhythm:Regular Rate:Normal     Neuro/Psych    GI/Hepatic Neg liver ROS, GERD  ,  Endo/Other  negative endocrine ROS  Renal/GU negative Renal ROS     Musculoskeletal negative musculoskeletal ROS (+)   Abdominal (+) + obese,   Peds  Hematology negative hematology ROS (+)   Anesthesia Other Findings   Reproductive/Obstetrics                             Anesthesia Physical Anesthesia Plan  ASA: II  Anesthesia Plan: General   Post-op Pain Management:    Induction: Intravenous  Airway Management Planned: Nasal Cannula  Additional Equipment:   Intra-op Plan:   Post-operative Plan:   Informed Consent: I have reviewed the patients History and Physical, chart, labs and discussed the procedure including the risks, benefits and alternatives for the proposed anesthesia with the patient or authorized representative who has indicated his/her understanding and acceptance.     Plan Discussed with: CRNA  Anesthesia Plan Comments:         Anesthesia Quick Evaluation

## 2015-02-22 NOTE — Anesthesia Procedure Notes (Signed)
Date/Time: 02/22/2015 11:17 AM Performed by: Nelda Marseille Pre-anesthesia Checklist: Patient identified, Emergency Drugs available, Suction available, Patient being monitored and Timeout performed Oxygen Delivery Method: Nasal cannula

## 2015-02-22 NOTE — Anesthesia Postprocedure Evaluation (Signed)
  Anesthesia Post-op Note  Patient: [name]  Procedure(s) Performed: Procedure(s): COLONOSCOPY (N/A)  Anesthesia type:General  Patient location: PACU  Post pain: Pain level controlled  Post assessment: Post-op Vital signs reviewed, Patient's Cardiovascular Status Stable, Respiratory Function Stable, Patent Airway and No signs of Nausea or vomiting  Post vital signs: Reviewed and stable  Last Vitals:  Filed Vitals:   02/22/15 1129  BP:   Pulse: 76  Temp: 35.9 C  Resp: 26    Level of consciousness: awake, alert  and patient cooperative  Complications: No apparent anesthesia complications

## 2015-02-22 NOTE — Transfer of Care (Signed)
Immediate Anesthesia Transfer of Care Note  Patient: [name]  Procedure(s) Performed: Procedure(s): COLONOSCOPY (N/A)  Patient Location: PACU  Anesthesia Type:General  Level of Consciousness: awake and oriented  Airway & Oxygen Therapy: Patient Spontanous Breathing and Patient connected to nasal cannula oxygen  Post-op Assessment: Report given to RN and Post -op Vital signs reviewed and stable  Post vital signs: Reviewed and stable  Last Vitals:  Filed Vitals:   02/22/15 0954  BP: 137/79  Pulse: 74  Temp: 36.4 C  Resp: 14    Complications: No apparent anesthesia complications

## 2015-02-22 NOTE — H&P (Signed)
    Primary Care Physician:  No PCP Per Patient Primary Gastroenterologist:  Dr. Candace Cruise  Pre-Procedure History & Physical: HPI:  [name] is a 64 y.o. female is here for an colonoscopy   Past Medical History  Diagnosis Date  . Hypertension   . GERD (gastroesophageal reflux disease)   . Anginal pain [facility])     Past Surgical History  Procedure Laterality Date  . Wrist surgery    . Knee arthroscopy and arthrotomy    . Abdominal hysterectomy    . Cesarean section      Prior to Admission medications   Medication Sig Start Date End Date Taking? Authorizing Provider  hydrochlorothiazide (HYDRODIURIL) 25 MG tablet Take 25 mg by mouth daily.   Yes Historical Provider, MD  lisinopril (PRINIVIL,ZESTRIL) 10 MG tablet Take 10 mg by mouth daily.   Yes Historical Provider, MD  omeprazole (PRILOSEC) 20 MG capsule Take 20 mg by mouth daily.   Yes Historical Provider, MD  polyethylene glycol powder (GLYCOLAX/MIRALAX) powder Take 1 Container by mouth once.   Yes Historical Provider, MD    Allergies as of 01/28/2015  . (Not on File)    History reviewed. No pertinent family history.  Social History   Social History  . Marital Status: Divorced    Spouse Name: N/A  . Number of Children: N/A  . Years of Education: N/A   Occupational History  . Not on file.   Social History Main Topics  . Smoking status: Never Smoker   . Smokeless tobacco: Not on file  . Alcohol Use: Not on file  . Drug Use: Not on file  . Sexual Activity: Not on file   Other Topics Concern  . Not on file   Social History Narrative    Review of Systems: See HPI, otherwise negative ROS  Physical Exam: BP 137/79 mmHg  Pulse 74  Temp(Src) 97.5 F (36.4 C) (Tympanic)  Resp 14  Ht 5\' 3"  (1.6 m)  Wt 94.348 kg (208 lb)  BMI 36.85 kg/m2  SpO2 100% General:   Alert,  pleasant and cooperative in NAD Head:  Normocephalic and atraumatic. Neck:  Supple; no masses or thyromegaly. Lungs:  Clear throughout to  auscultation.    Heart:  Regular rate and rhythm. Abdomen:  Soft, nontender and nondistended. Normal bowel sounds, without guarding, and without rebound.   Neurologic:  Alert and  oriented x4;  grossly normal neurologically.  Impression/Plan: [name] is here for an colonoscopy to be performed for screening, family hx of colon cancer.  Risks, benefits, limitations, and alternatives regarding colonoscopy have been reviewed with the patient.  Questions have been answered.  All parties agreeable.   Cederick Broadnax, Lupita Dawn, MD  02/22/2015, 10:10 AM

## 2015-02-22 NOTE — Op Note (Signed)
[facility] Gastroenterology Patient Name: [name] Procedure Date: 02/22/2015 11:07 AM MRN: [id number] Account #: [id number] Date of Birth: 1951/02/10 Admit Type: Outpatient Age: 64 Room: [facility] ENDO ROOM 4 Gender: Female Note Status: Finalized Procedure:         Colonoscopy Indications:       Screening in patient at increased risk: Family history of                     1st-degree relative with colorectal cancer Providers:         Lupita Dawn. Candace Cruise, MD Referring MD:      Forest Gleason Md, MD (Referring MD) Medicines:         Monitored Anesthesia Care Complications:     No immediate complications. Procedure:         Pre-Anesthesia Assessment:                    - Prior to the procedure, a History and Physical was                     performed, and patient medications, allergies and                     sensitivities were reviewed. The patient's tolerance of                     previous anesthesia was reviewed.                    - The risks and benefits of the procedure and the sedation                     options and risks were discussed with the patient. All                     questions were answered and informed consent was obtained.                    - After reviewing the risks and benefits, the patient was                     deemed in satisfactory condition to undergo the procedure.                    After obtaining informed consent, the colonoscope was                     passed under direct vision. Throughout the procedure, the                     patient's blood pressure, pulse, and oxygen saturations                     were monitored continuously. The Olympus CF-Q160AL                     colonoscope (S#. [id number]) was introduced through the anus                     and advanced to the the cecum, identified by appendiceal                     orifice and ileocecal valve. The colonoscopy was performed  without difficulty. The patient tolerated  the procedure                     well. Findings:      Multiple small and large-mouthed diverticula were found in the sigmoid       colon.      The exam was otherwise without abnormality. Impression:        - Diverticulosis in the sigmoid colon.                    - The examination was otherwise normal.                    - No specimens collected. Recommendation:    - Discharge patient to home.                    - Repeat colonoscopy in 5 years for surveillance.                    - The findings and recommendations were discussed with the                     patient. Procedure Code(s): --- Professional ---                    [id number], Colonoscopy, flexible; diagnostic, including                     collection of specimen(s) by brushing or washing, when                     performed (separate procedure) Diagnosis Code(s): --- Professional ---                    Z80.0, Family history of malignant neoplasm of digestive                     organs                    K57.30, Diverticulosis of large intestine without                     perforation or abscess without bleeding CPT copyright 2014 American Medical Association. All rights reserved. The codes documented in this report are preliminary and upon coder review may  be revised to meet current compliance requirements. Hulen Luster, MD 02/22/2015 11:26:51 AM This report has been signed electronically. Number of Addenda: 0 Note Initiated On: 02/22/2015 11:07 AM Scope Withdrawal Time: 0 hours 2 minutes 22 seconds  Total Procedure Duration: 0 hours 7 minutes 16 seconds       [facility]

## 2015-02-27 ENCOUNTER — Encounter: Payer: Self-pay | Admitting: Gastroenterology

## 2015-03-01 IMAGING — CR DG CHEST 1V PORT
1 series · 1 of 1 positions shown · non-contrast
Comparison: 02/25/2011

CLINICAL DATA: Cough

EXAM:
PORTABLE CHEST - 1 VIEW

[ap]
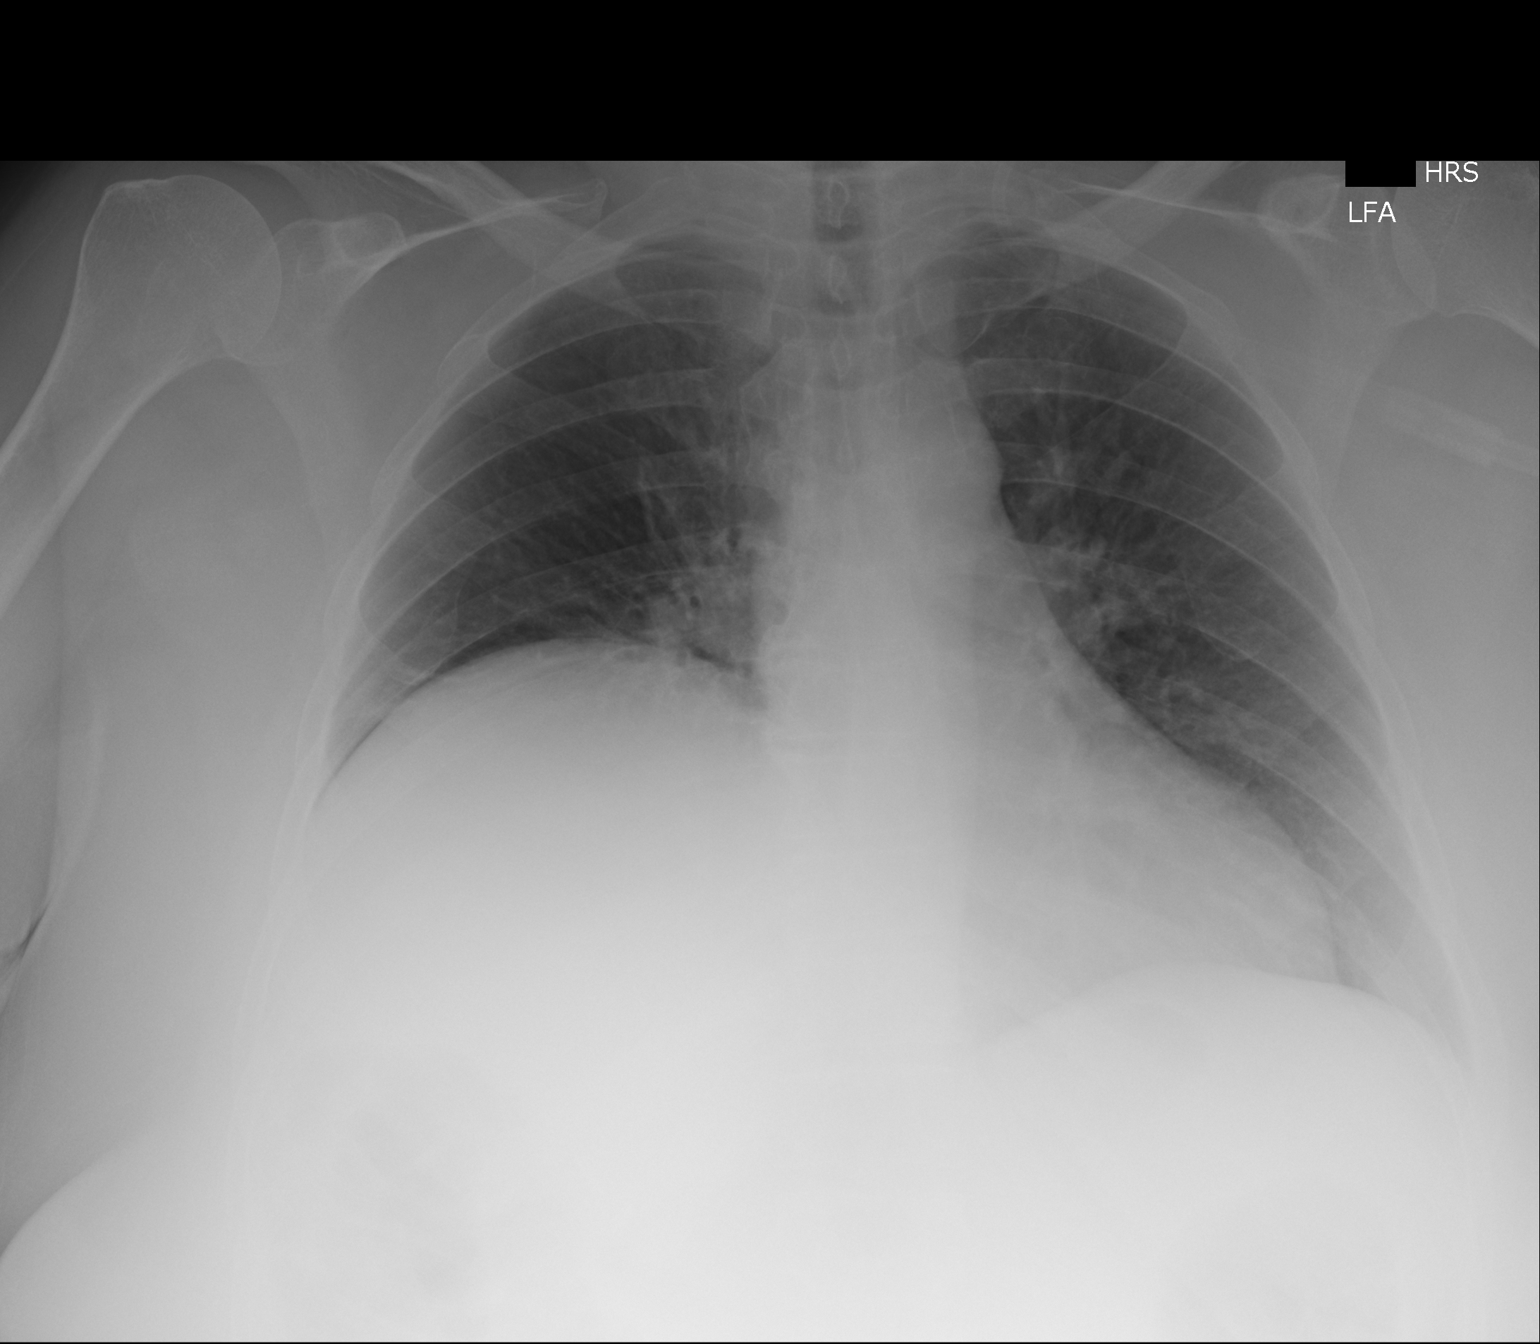

[1 of 1 positions shown; findings below may reference images not displayed]

FINDINGS: Right hemidiaphragm remains elevated with basilar atelectasis. Left
lung is clear. Upper normal heart size. No pneumothorax.
IMPRESSION: No active disease.  Chronic elevation of the right hemidiaphragm.

## 2016-06-03 ENCOUNTER — Other Ambulatory Visit: Payer: Self-pay | Admitting: Family Medicine

## 2016-06-03 DIAGNOSIS — Z1239 Encounter for other screening for malignant neoplasm of breast: Secondary | ICD-10-CM

## 2016-08-13 DIAGNOSIS — E2839 Other primary ovarian failure: Secondary | ICD-10-CM | POA: Insufficient documentation

## 2016-09-24 ENCOUNTER — Other Ambulatory Visit: Payer: Self-pay | Admitting: Obstetrics and Gynecology

## 2016-09-24 DIAGNOSIS — E2839 Other primary ovarian failure: Secondary | ICD-10-CM

## 2016-11-17 ENCOUNTER — Ambulatory Visit
Admission: RE | Admit: 2016-11-17 | Discharge: 2016-11-17 | Disposition: A | Discharge status: Home / Self Care | Payer: Medicare Other | Source: Ambulatory Visit | Attending: Obstetrics and Gynecology | Admitting: Obstetrics and Gynecology

## 2016-11-17 ENCOUNTER — Ambulatory Visit
Admission: RE | Admit: 2016-11-17 | Discharge: 2016-11-17 | Disposition: A | Discharge status: Home / Self Care | Payer: Medicare Other | Source: Ambulatory Visit | Attending: Family Medicine | Admitting: Family Medicine

## 2016-11-17 DIAGNOSIS — E2839 Other primary ovarian failure: Secondary | ICD-10-CM | POA: Diagnosis not present

## 2016-11-17 DIAGNOSIS — Z1231 Encounter for screening mammogram for malignant neoplasm of breast: Secondary | ICD-10-CM | POA: Diagnosis present

## 2016-11-17 DIAGNOSIS — M8588 Other specified disorders of bone density and structure, other site: Secondary | ICD-10-CM | POA: Diagnosis not present

## 2016-11-17 DIAGNOSIS — Z1239 Encounter for other screening for malignant neoplasm of breast: Secondary | ICD-10-CM

## 2017-02-19 ENCOUNTER — Other Ambulatory Visit: Payer: Self-pay

## 2017-02-19 ENCOUNTER — Emergency Department
Admission: EM | Admit: 2017-02-19 | Discharge: 2017-02-19 | Disposition: A | Discharge status: Home / Self Care | Payer: Medicare Other | Attending: Emergency Medicine | Admitting: Emergency Medicine

## 2017-02-19 ENCOUNTER — Emergency Department: Payer: Medicare Other

## 2017-02-19 ENCOUNTER — Encounter: Payer: Self-pay | Admitting: Emergency Medicine

## 2017-02-19 DIAGNOSIS — R197 Diarrhea, unspecified: Secondary | ICD-10-CM | POA: Diagnosis not present

## 2017-02-19 DIAGNOSIS — R531 Weakness: Secondary | ICD-10-CM | POA: Diagnosis not present

## 2017-02-19 DIAGNOSIS — I10 Essential (primary) hypertension: Secondary | ICD-10-CM | POA: Insufficient documentation

## 2017-02-19 DIAGNOSIS — R5383 Other fatigue: Secondary | ICD-10-CM | POA: Insufficient documentation

## 2017-02-19 DIAGNOSIS — Z79899 Other long term (current) drug therapy: Secondary | ICD-10-CM | POA: Insufficient documentation

## 2017-02-19 DIAGNOSIS — R10817 Generalized abdominal tenderness: Secondary | ICD-10-CM | POA: Insufficient documentation

## 2017-02-19 DIAGNOSIS — R5381 Other malaise: Secondary | ICD-10-CM | POA: Diagnosis not present

## 2017-02-19 LAB — URINALYSIS, COMPLETE (UACMP) WITH MICROSCOPIC
Bilirubin Urine: NEGATIVE
Glucose, UA: NEGATIVE mg/dL
HGB URINE DIPSTICK: NEGATIVE
KETONES UR: NEGATIVE mg/dL
LEUKOCYTES UA: NEGATIVE
NITRITE: NEGATIVE
PH: 7 (ref 5.0–8.0)
Protein, ur: NEGATIVE mg/dL
RBC / HPF: NONE SEEN RBC/hpf (ref 0–5)
SPECIFIC GRAVITY, URINE: 1.006 (ref 1.005–1.030)

## 2017-02-19 LAB — BASIC METABOLIC PANEL
ANION GAP: 12 (ref 5–15)
BUN: 36 mg/dL — AB (ref 6–20)
CALCIUM: 9.5 mg/dL (ref 8.9–10.3)
CO2: 24 mmol/L (ref 22–32)
Chloride: 100 mmol/L — ABNORMAL LOW (ref 101–111)
Creatinine, Ser: 1.06 mg/dL — ABNORMAL HIGH (ref 0.44–1.00)
GFR calc Af Amer: >60 mL/min (ref >60)
GFR, EST NON AFRICAN AMERICAN: 53 mL/min — AB (ref >60)
Glucose, Bld: 117 mg/dL — ABNORMAL HIGH (ref 65–99)
POTASSIUM: 4.7 mmol/L (ref 3.5–5.1)
SODIUM: 136 mmol/L (ref 135–145)

## 2017-02-19 LAB — CBC
HCT: 42.1 % (ref 35.0–47.0)
HEMOGLOBIN: 13.7 g/dL (ref 12.0–16.0)
MCH: 29.2 pg (ref 26.0–34.0)
MCHC: 32.6 g/dL (ref 32.0–36.0)
MCV: 89.7 fL (ref 80.0–100.0)
Platelets: 228 10*3/uL (ref 150–440)
RBC: 4.69 MIL/uL (ref 3.80–5.20)
RDW: 13.3 % (ref 11.5–14.5)
WBC: 8.2 10*3/uL (ref 3.6–11.0)

## 2017-02-19 LAB — HEPATIC FUNCTION PANEL
ALT: 20 U/L (ref 14–54)
AST: 18 U/L (ref 15–41)
Albumin: 4.2 g/dL (ref 3.5–5.0)
Alkaline Phosphatase: 103 U/L (ref 38–126)
Bilirubin, Direct: <0.1 mg/dL — ABNORMAL LOW (ref 0.1–0.5)
Total Bilirubin: 0.7 mg/dL (ref 0.3–1.2)
Total Protein: 7.9 g/dL (ref 6.5–8.1)

## 2017-02-19 LAB — LIPASE, BLOOD: Lipase: 24 U/L (ref 11–51)

## 2017-02-19 LAB — TROPONIN I: Troponin I: <0.03 ng/mL (ref <0.03)

## 2017-02-19 MED ORDER — IOPAMIDOL (ISOVUE-300) INJECTION 61%
100.0000 mL | Freq: Once | INTRAVENOUS | Status: AC | PRN
  Administered 2017-02-19: 100 mL via INTRAVENOUS

## 2017-02-19 MED ORDER — FAMOTIDINE 20 MG PO TABS: 20.0000 mg | ORAL_TABLET | Freq: Two times a day (BID) | ORAL | 0 refills | Status: DC

## 2017-02-19 MED ORDER — SODIUM CHLORIDE 0.9 % IV BOLUS (SEPSIS)
1000.0000 mL | Freq: Once | INTRAVENOUS | Status: AC
  Administered 2017-02-19: 1000 mL via INTRAVENOUS

## 2017-02-19 MED ORDER — ONDANSETRON 4 MG PO TBDP: 4.0000 mg | ORAL_TABLET | Freq: Three times a day (TID) | ORAL | 0 refills | Status: DC | PRN

## 2017-02-19 MED ORDER — LOPERAMIDE HCL 2 MG PO TABS: 4.0000 mg | ORAL_TABLET | Freq: Four times a day (QID) | ORAL | 0 refills | Status: DC | PRN

## 2017-02-19 MED ORDER — IOPAMIDOL (ISOVUE-300) INJECTION 61%
30.0000 mL | Freq: Once | INTRAVENOUS | Status: AC | PRN
  Administered 2017-02-19: 30 mL via ORAL

## 2017-02-19 NOTE — ED Triage Notes (Signed)
Pt c/o SHOB starting today. Denies any pain. Has had generalized weakness for last week.  Respirations unlabored. Skin warm and dry.  No fevers.  No cough.  VSS. NAD.

## 2017-02-19 NOTE — ED Notes (Signed)
Patient denies pain and is resting comfortably.  

## 2017-02-19 NOTE — ED Provider Notes (Signed)
[facility] Emergency Department Provider Note  ____________________________________________  Time seen: Approximately 8:36 PM  I have reviewed the triage vital signs and the nursing notes.   HISTORY  Chief Complaint Shortness of Breath    HPI [name] is a 66 y.o. female he complains of generalized weakness and fatigue. She reports that whenever she is up on her feet she just feels tired and wants to rest. Does note some loose watery bowel movements recently. No chest pain or shortness of breath for me. No fevers chills sweats body aches sore throat runny nose cough or congestion. No vomiting, normal oral intake. Denies any focal pain. No aggravating or alleviating factors other than doing more tired when she is up on her feet. Symptoms been constant for approximately one week.     Past Medical History:  Diagnosis Date  . Anginal pain ([place])   . GERD (gastroesophageal reflux disease)   . Hypertension      There are no active problems to display for this patient.    Past Surgical History:  Procedure Laterality Date  . ABDOMINAL HYSTERECTOMY    . CESAREAN SECTION    . KNEE ARTHROSCOPY AND ARTHROTOMY    . WRIST SURGERY       Prior to Admission medications   Medication Sig Start Date End Date Taking? Authorizing Provider  albuterol (VENTOLIN HFA) 108 (90 Base) MCG/ACT inhaler Inhale 2 puffs every 6 (six) hours as needed into the lungs. 02/04/16  Yes [provider]  amitriptyline (ELAVIL) 25 MG tablet Take 1 tablet daily by mouth. 04/25/14  Yes [provider]  amLODipine (NORVASC) 5 MG tablet Take 1 tablet daily by mouth.   Yes [provider]  cetirizine (ZYRTEC) 10 MG tablet Take 1 tablet daily by mouth.   Yes [provider]  hydrochlorothiazide (HYDRODIURIL) 25 MG tablet Take 25 mg by mouth daily.   Yes [provider]  losartan (COZAAR) 100 MG tablet Take 1 tablet daily by mouth. 01/21/17  Yes  [provider]  omeprazole (PRILOSEC) 20 MG capsule Take 20 mg by mouth daily.   Yes [provider]  promethazine (PHENERGAN) 12.5 MG tablet Take 1 tablet every 8 (eight) hours as needed by mouth. 02/10/17  Yes [provider]  polyethylene glycol powder (GLYCOLAX/MIRALAX) powder Take 1 Container by mouth once.    [provider]  predniSONE (DELTASONE) 50 MG tablet Take 1 tablet daily by mouth. 02/10/17   [provider]     Allergies Aspirin; Codeine; Norco [hydrocodone-acetaminophen]; and Percocet [oxycodone-acetaminophen]   Family History  Problem Relation Age of Onset  . Breast cancer Neg Hx     Social History Social History   Tobacco Use  . Smoking status: Never Smoker  . Smokeless tobacco: Never Used  Substance Use Topics  . Alcohol use: Not on file  . Drug use: Not on file    Review of Systems  Constitutional:   No fever or chills.  ENT:   No sore throat. No rhinorrhea. Cardiovascular:   No chest pain or syncope. Respiratory:   No dyspnea or cough. Gastrointestinal:   Negative for abdominal pain, vomiting and diarrhea.  Musculoskeletal:   Negative for focal pain or swelling All other systems reviewed and are negative except as documented above in ROS and HPI.  ____________________________________________   PHYSICAL EXAM:  VITAL SIGNS: ED Triage Vitals  Enc Vitals Group     BP 02/19/17 1126 (!) 152/57     Pulse  Rate 02/19/17 1126 76     Resp 02/19/17 1126 20     Temp 02/19/17 1126 97.7 F (36.5 C)     Temp Source 02/19/17 1126 Oral     SpO2 02/19/17 1126 100 %     Weight 02/19/17 1126 200 lb (90.7 kg)     Height 02/19/17 1126 5\' 3"  (1.6 m)     Head Circumference --      Peak Flow --      Pain Score 02/19/17 1543 8     Pain Loc --      Pain Edu? --      Excl. in [place]? --     Vital signs reviewed, nursing assessments reviewed.   Constitutional:   Alert and oriented. Well appearing and in no  distress. Eyes:   No scleral icterus.  EOMI. No nystagmus. No conjunctival pallor. PERRL. ENT   Head:   Normocephalic and atraumatic.   Nose:   No congestion/rhinnorhea.    Mouth/Throat:   MMM, no pharyngeal erythema. No peritonsillar mass.    Neck:   No meningismus. Full ROM. Hematological/Lymphatic/Immunilogical:   No cervical lymphadenopathy. Cardiovascular:   RRR. Symmetric bilateral radial and DP pulses.  No murmurs.  Respiratory:   Normal respiratory effort without tachypnea/retractions. Breath sounds are clear and equal bilaterally. No wheezes/rales/rhonchi. Gastrointestinal:   Soft with mild generalized tenderness. Non distended. There is no CVA tenderness.  No rebound, rigidity, or guarding. Genitourinary:   deferred Musculoskeletal:   Normal range of motion in all extremities. No joint effusions.  No lower extremity tenderness.  No edema. Neurologic:   Normal speech and language.  Motor grossly intact. No gross focal neurologic deficits are appreciated.  Skin:    Skin is warm, dry and intact. No rash noted.  No petechiae, purpura, or bullae.  ____________________________________________    LABS (pertinent positives/negatives) (all labs ordered are listed, but only abnormal results are displayed) Labs Reviewed  BASIC METABOLIC PANEL - Abnormal; Notable for the following components:      Result Value   Chloride 100 (*)    Glucose, Bld 117 (*)    BUN 36 (*)    Creatinine, Ser 1.06 (*)    GFR calc non Af Amer 53 (*)    All other components within normal limits  URINALYSIS, COMPLETE (UACMP) WITH MICROSCOPIC - Abnormal; Notable for the following components:   Color, Urine YELLOW (*)    APPearance CLEAR (*)    Bacteria, UA RARE (*)    Squamous Epithelial / LPF 0-5 (*)    All other components within normal limits  HEPATIC FUNCTION PANEL - Abnormal; Notable for the following components:   Bilirubin, Direct <0.1 (*)    All other components within normal limits   CBC  TROPONIN I  LIPASE, BLOOD   ____________________________________________   EKG  interpreted by me Normal sinus rhythm rate of 68, normal axis and intervals. Normal QRS ST segments and T waves. Small Q waves present in the lateral leads consistent with prior ischemic event.  ____________________________________________    RADIOLOGY  Dg Chest 2 View  Result Date: 02/19/2017 CLINICAL DATA:  Shortness of breath beginning today, generalized weakness for 1 week, history hypertension and GERD EXAM: CHEST  2 VIEW COMPARISON:  11/17/2013 FINDINGS: Normal heart size, mediastinal contours, and pulmonary vascularity. Mild chronic bronchitic changes. Chronic elevation of RIGHT diaphragm with mild RIGHT basilar atelectasis. No acute infiltrate, pleural effusion or pneumothorax. Bones unremarkable. IMPRESSION: Chronic bronchitic changes and mild RIGHT basilar atelectasis.  No acute abnormalities. Electronically Signed   By: Lavonia Dana M.D.   On: 02/19/2017 12:07   Ct Abdomen Pelvis W Contrast  Result Date: 02/19/2017 CLINICAL DATA:  Diarrhea and generalized weakness EXAM: CT ABDOMEN AND PELVIS WITH CONTRAST TECHNIQUE: Multidetector CT imaging of the abdomen and pelvis was performed using the standard protocol following bolus administration of intravenous contrast. CONTRAST:  114mL ISOVUE-300 IOPAMIDOL (ISOVUE-300) INJECTION 61% COMPARISON:  February 25, 2011 FINDINGS: Lower chest: Coronary artery calcifications. The lung bases are otherwise normal. Hepatobiliary: Hepatic steatosis. Previous cholecystectomy. The liver and portal vein are otherwise normal. Pancreas: Unremarkable. No pancreatic ductal dilatation or surrounding inflammatory changes. Spleen: Normal in size without focal abnormality. Adrenals/Urinary Tract: Adrenal nodularity is stable since 2012, of no acute significance. , the kidneys, and ureters, and bladder are normal. Stomach/Bowel: The stomach and small bowel are normal. Colonic  diverticulosis is seen without diverticulitis. The colon is otherwise normal in appearance. The appendix is not seen but there is no secondary evidence of appendicitis. Vascular/Lymphatic: Scattered atherosclerotic change in the nonaneurysmal aorta. No adenopathy. Reproductive: Status post hysterectomy. No adnexal masses. Other: A fat containing periumbilical hernia is identified. Musculoskeletal: No acute or significant osseous findings. IMPRESSION: 1. No acute abnormality identified. 2. Atherosclerosis in the nonaneurysmal aorta. Coronary artery calcifications. 3. Adrenal hyperplasia/adenomas, unchanged since 2012. 4. Colonic diverticulosis without diverticulitis. 5. The appendix is not seen but there is no secondary evidence of appendicitis. Aortic Atherosclerosis (ICD10-I70.0). Electronically Signed   By: Dorise Bullion III M.D   On: 02/19/2017 17:47    ____________________________________________   PROCEDURES Procedures  ____________________________________________   DIFFERENTIAL DIAGNOSIS  appendicitis, cholecystitis, diverticulitis  CLINICAL IMPRESSION / ASSESSMENT AND PLAN / ED COURSE  Pertinent labs & imaging results that were available during my care of the patient were reviewed by me and considered in my medical decision making (see chart for details).   patient not in distress, but low energy, exam reveals evidence of intra-abdominal process. Symptoms are not focal, exam is not localizing,so proceeded with CT scan for further evaluation. CT is unremarkable. Labs are unremarkable. Patient given IV fluids for hydration, counseled to follow up with primary care. Symptomatically management with Zofran and loperamide and Pepcid.      ____________________________________________   FINAL CLINICAL IMPRESSION(S) / ED DIAGNOSES    Final diagnoses:  Malaise and fatigue      This SmartLink is deprecated. Use AVSMEDLIST instead to display the medication list for a  patient.   Portions of this note were generated with dragon dictation software. Dictation errors may occur despite best attempts at proofreading.    Carrie Mew, MD 02/19/17 2040

## 2017-02-19 NOTE — ED Notes (Signed)
Patient had watery diarrhea stool with recent BM

## 2017-02-19 NOTE — ED Notes (Signed)
Reported to CT that patient completed contrast

## 2018-02-18 DIAGNOSIS — M8580 Other specified disorders of bone density and structure, unspecified site: Secondary | ICD-10-CM | POA: Insufficient documentation

## 2018-02-21 ENCOUNTER — Other Ambulatory Visit: Payer: Self-pay | Admitting: Obstetrics and Gynecology

## 2018-02-21 DIAGNOSIS — Z1231 Encounter for screening mammogram for malignant neoplasm of breast: Secondary | ICD-10-CM

## 2018-02-28 DIAGNOSIS — E785 Hyperlipidemia, unspecified: Secondary | ICD-10-CM | POA: Insufficient documentation

## 2019-01-20 ENCOUNTER — Other Ambulatory Visit: Payer: Self-pay | Admitting: Family Medicine

## 2019-01-20 ENCOUNTER — Ambulatory Visit
Admission: RE | Admit: 2019-01-20 | Discharge: 2019-01-20 | Disposition: A | Discharge status: Home / Self Care | Payer: Medicare Other | Source: Ambulatory Visit | Attending: Family Medicine | Admitting: Family Medicine

## 2019-01-20 DIAGNOSIS — M25551 Pain in right hip: Secondary | ICD-10-CM

## 2019-01-20 DIAGNOSIS — M545 Low back pain, unspecified: Secondary | ICD-10-CM

## 2019-01-20 DIAGNOSIS — M549 Dorsalgia, unspecified: Secondary | ICD-10-CM | POA: Insufficient documentation

## 2019-03-24 DIAGNOSIS — M2021 Hallux rigidus, right foot: Secondary | ICD-10-CM | POA: Insufficient documentation

## 2019-03-29 DIAGNOSIS — R748 Abnormal levels of other serum enzymes: Secondary | ICD-10-CM | POA: Insufficient documentation

## 2019-03-29 DIAGNOSIS — R946 Abnormal results of thyroid function studies: Secondary | ICD-10-CM | POA: Insufficient documentation

## 2019-07-13 ENCOUNTER — Emergency Department
Admission: EM | Admit: 2019-07-13 | Discharge: 2019-07-13 | Disposition: A | Discharge status: Home / Self Care | Payer: Medicare Other | Attending: Emergency Medicine | Admitting: Emergency Medicine

## 2019-07-13 ENCOUNTER — Emergency Department: Payer: Medicare Other

## 2019-07-13 ENCOUNTER — Encounter: Payer: Self-pay | Admitting: Emergency Medicine

## 2019-07-13 ENCOUNTER — Other Ambulatory Visit: Payer: Self-pay

## 2019-07-13 DIAGNOSIS — R200 Anesthesia of skin: Secondary | ICD-10-CM | POA: Diagnosis not present

## 2019-07-13 DIAGNOSIS — I10 Essential (primary) hypertension: Secondary | ICD-10-CM | POA: Diagnosis not present

## 2019-07-13 DIAGNOSIS — R519 Headache, unspecified: Secondary | ICD-10-CM | POA: Diagnosis present

## 2019-07-13 DIAGNOSIS — Z79899 Other long term (current) drug therapy: Secondary | ICD-10-CM | POA: Insufficient documentation

## 2019-07-13 LAB — CBC
HCT: 42.3 % (ref 36.0–46.0)
Hemoglobin: 13.6 g/dL (ref 12.0–15.0)
MCH: 29.8 pg (ref 26.0–34.0)
MCHC: 32.2 g/dL (ref 30.0–36.0)
MCV: 92.8 fL (ref 80.0–100.0)
Platelets: 229 10*3/uL (ref 150–400)
RBC: 4.56 MIL/uL (ref 3.87–5.11)
RDW: 12.9 % (ref 11.5–15.5)
WBC: 14.2 10*3/uL — ABNORMAL HIGH (ref 4.0–10.5)
nRBC: 0 % (ref 0.0–0.2)

## 2019-07-13 LAB — COMPREHENSIVE METABOLIC PANEL
ALT: 21 U/L (ref 0–44)
AST: 15 U/L (ref 15–41)
Albumin: 4.3 g/dL (ref 3.5–5.0)
Alkaline Phosphatase: 107 U/L (ref 38–126)
Anion gap: 11 (ref 5–15)
BUN: 24 mg/dL — ABNORMAL HIGH (ref 8–23)
CO2: 25 mmol/L (ref 22–32)
Calcium: 9.5 mg/dL (ref 8.9–10.3)
Chloride: 101 mmol/L (ref 98–111)
Creatinine, Ser: 1.05 mg/dL — ABNORMAL HIGH (ref 0.44–1.00)
GFR calc Af Amer: >60 mL/min (ref >60)
GFR calc non Af Amer: 54 mL/min — ABNORMAL LOW (ref >60)
Glucose, Bld: 130 mg/dL — ABNORMAL HIGH (ref 70–99)
Potassium: 4.6 mmol/L (ref 3.5–5.1)
Sodium: 137 mmol/L (ref 135–145)
Total Bilirubin: 0.7 mg/dL (ref 0.3–1.2)
Total Protein: 8 g/dL (ref 6.5–8.1)

## 2019-07-13 LAB — LIPASE, BLOOD: Lipase: 26 U/L (ref 11–51)

## 2019-07-13 MED ORDER — SODIUM CHLORIDE 0.9% FLUSH: 3.0000 mL | Freq: Once | INTRAVENOUS | Status: DC

## 2019-07-13 MED ORDER — BUTALBITAL-APAP-CAFFEINE 50-325-40 MG PO TABS
2.0000 | ORAL_TABLET | Freq: Once | ORAL | Status: AC
  Administered 2019-07-13: 2 via ORAL
  Filled 2019-07-13: qty 2

## 2019-07-13 MED ORDER — DIPHENHYDRAMINE HCL 50 MG/ML IJ SOLN
25.0000 mg | Freq: Once | INTRAMUSCULAR | Status: AC
  Administered 2019-07-13: 25 mg via INTRAVENOUS
  Filled 2019-07-13: qty 1

## 2019-07-13 MED ORDER — BUTALBITAL-APAP-CAFFEINE 50-325-40 MG PO TABS: 1.0000 | ORAL_TABLET | Freq: Four times a day (QID) | ORAL | 0 refills | Status: AC | PRN

## 2019-07-13 MED ORDER — SODIUM CHLORIDE 0.9 % IV BOLUS
1000.0000 mL | Freq: Once | INTRAVENOUS | Status: AC
  Administered 2019-07-13: 1000 mL via INTRAVENOUS

## 2019-07-13 MED ORDER — METOCLOPRAMIDE HCL 5 MG/ML IJ SOLN
10.0000 mg | Freq: Once | INTRAMUSCULAR | Status: AC
  Administered 2019-07-13: 10 mg via INTRAVENOUS
  Filled 2019-07-13: qty 2

## 2019-07-13 NOTE — ED Triage Notes (Signed)
C/O Headache and vomiting x 1 day.  STates unable to tolerate any po.  C/O headache to left side forehead and ear.  AAOx3.  Skin warm and dry. NAD

## 2019-07-13 NOTE — ED Provider Notes (Signed)
[facility] Emergency Department Provider Note ____________________________________________   First MD Initiated Contact with Patient 07/13/19 1636     (approximate)  I have reviewed the triage vital signs and the nursing notes.   HISTORY  Chief Complaint Emesis and Headache    HPI [name] is a 69 y.o. female with PMH as noted below who presents with a headache, bilateral but somewhat worse on the left, gradual onset over the last 2 to 3 days, and radiating down to her face, jaw, and anterior neck.  She states it hurts somewhat to turn her head, but denies any stiffness in the neck.  She has no fever or chills.  She reports nausea and some episodes of vomiting, most recently this morning.  She has mild photophobia.  She denies any recent head trauma.  The patient states that she did have a similar headache once a few years ago and was told it was related to her sinuses.  She has had some rhinorrhea.  Past Medical History:  Diagnosis Date  . Anginal pain ([place])   . GERD (gastroesophageal reflux disease)   . Hypertension     There are no problems to display for this patient.   Past Surgical History:  Procedure Laterality Date  . ABDOMINAL HYSTERECTOMY    . CESAREAN SECTION    . COLONOSCOPY N/A 02/22/2015   Procedure: COLONOSCOPY;  Surgeon: Hulen Luster, MD;  Location: [facility] ENDOSCOPY;  Service: Gastroenterology;  Laterality: N/A;  . KNEE ARTHROSCOPY AND ARTHROTOMY    . WRIST SURGERY      Prior to Admission medications   Medication Sig Start Date End Date Taking? Authorizing Provider  albuterol (VENTOLIN HFA) 108 (90 Base) MCG/ACT inhaler Inhale 2 puffs every 6 (six) hours as needed into the lungs. 02/04/16   [provider]  amitriptyline (ELAVIL) 25 MG tablet Take 1 tablet daily by mouth. 04/25/14   [provider]  amLODipine (NORVASC) 5 MG tablet Take 1 tablet daily by mouth.    [provider]  cetirizine (ZYRTEC) 10 MG  tablet Take 1 tablet daily by mouth.    [provider]  famotidine (PEPCID) 20 MG tablet Take 1 tablet (20 mg total) 2 (two) times daily by mouth. 02/19/17   Carrie Mew, MD  hydrochlorothiazide (HYDRODIURIL) 25 MG tablet Take 25 mg by mouth daily.    [provider]  loperamide (IMODIUM A-D) 2 MG tablet Take 2 tablets (4 mg total) 4 (four) times daily as needed by mouth for diarrhea or loose stools. 02/19/17   Carrie Mew, MD  losartan (COZAAR) 100 MG tablet Take 1 tablet daily by mouth. 01/21/17   [provider]  omeprazole (PRILOSEC) 20 MG capsule Take 20 mg by mouth daily.    [provider]  ondansetron (ZOFRAN ODT) 4 MG disintegrating tablet Take 1 tablet (4 mg total) every 8 (eight) hours as needed by mouth for nausea or vomiting. 02/19/17   Carrie Mew, MD  polyethylene glycol powder [facility]) powder Take 1 Container by mouth once.    [provider]  predniSONE (DELTASONE) 50 MG tablet Take 1 tablet daily by mouth. 02/10/17   [provider]  promethazine (PHENERGAN) 12.5 MG tablet Take 1 tablet every 8 (eight) hours as needed by mouth. 02/10/17   [provider]    Allergies Aspirin, Codeine, Norco [hydrocodone-acetaminophen], and Percocet [oxycodone-acetaminophen]  Family History  Problem Relation Age of Onset  . Breast cancer Neg Hx  Social History Social History   Tobacco Use  . Smoking status: Never Smoker  . Smokeless tobacco: Never Used  Substance Use Topics  . Alcohol use: Not on file  . Drug use: Not on file    Review of Systems  Constitutional: No fever/chills. Eyes: No visual changes. ENT: No sore throat. Cardiovascular: Denies chest pain. Respiratory: Denies shortness of breath. Gastrointestinal: Positive for nausea and vomiting. Genitourinary: Negative for flank pain. Musculoskeletal: Negative for back pain. Skin: Negative for rash. Neurological: Positive for  headache.   ____________________________________________   PHYSICAL EXAM:  VITAL SIGNS: ED Triage Vitals  Enc Vitals Group     BP 07/13/19 1016 (!) 146/61     Pulse Rate 07/13/19 1013 82     Resp 07/13/19 1013 16     Temp 07/13/19 1013 98 F (36.7 C)     Temp Source 07/13/19 1013 Oral     SpO2 07/13/19 1013 100 %     Weight 07/13/19 1010 211 lb (95.7 kg)     Height 07/13/19 1010 5\' 3"  (1.6 m)     Head Circumference --      Peak Flow --      Pain Score 07/13/19 1010 10     Pain Loc --      Pain Edu? --      Excl. in [place]? --     Constitutional: Alert and oriented.  Uncomfortable appearing but in no acute distress. Eyes: Conjunctivae are normal.  Mild photophobia.  EOMI.  PERRLA. Head: Atraumatic. Nose: No congestion/rhinnorhea. Mouth/Throat: Mucous membranes are moist.   Neck: Normal range of motion.  No meningeal signs.  Supple and nontender. Cardiovascular: Normal rate, regular rhythm.  Good peripheral circulation. Respiratory: Normal respiratory effort.  No retractions.  Gastrointestinal: Soft and nontender. No distention.  Genitourinary: No flank tenderness. Musculoskeletal: Extremities warm and well perfused.  Neurologic:  Normal speech and language.  5/5 motor strength and intact sensation to all extremities.  Normal coordination with no ataxia on finger-to-nose.  No pronator drift.  No facial droop. Skin:  Skin is warm and dry. No rash noted. Psychiatric: Mood and affect are normal. Speech and behavior are normal.  ____________________________________________   LABS (all labs ordered are listed, but only abnormal results are displayed)  Labs Reviewed  COMPREHENSIVE METABOLIC PANEL - Abnormal; Notable for the following components:      Result Value   Glucose, Bld 130 (*)    BUN 24 (*)    Creatinine, Ser 1.05 (*)    GFR calc non Af Amer 54 (*)    All other components within normal limits  CBC - Abnormal; Notable for the following components:   WBC 14.2 (*)      All other components within normal limits  LIPASE, BLOOD  URINALYSIS, COMPLETE (UACMP) WITH MICROSCOPIC   ____________________________________________  EKG   ____________________________________________  RADIOLOGY  CT head: No ICH or other acute findings CT maxillofacial: No sinus abnormalities or other acute findings  ____________________________________________   PROCEDURES  Procedure(s) performed: No  Procedures  Critical Care performed: No ____________________________________________   INITIAL IMPRESSION / ASSESSMENT AND PLAN / ED COURSE  Pertinent labs & imaging results that were available during my care of the patient were reviewed by me and considered in my medical decision making (see chart for details).  69 year old female with PMH as noted above presents with gradual onset of headache over the last 2 days associated with nausea and vomiting.  The headache radiates down towards her face  and anterior neck and is associated with some photophobia as well.  She has had this once before and was told it was related to sinuses.  On exam, the patient is uncomfortable but overall well-appearing.  Neurologic exam is normal.  The patient has no meningeal signs.  The physical exam is otherwise unremarkable.  Given the gradual onset of the headache, the fact that she has had it previously, and the normal neurologic exam, overall I suspect sinusitis, migraine, tension headache, or other benign etiology.  There are no clinical findings to suggest intracranial hemorrhage or meningitis.  Given the patient's age, I will obtain a CT head and maxillofacial.  Basic labs are unremarkable except for slightly elevated WBC count which is nonspecific.  I will treat symptomatically with Reglan and Benadryl.  The patient is allergic to NSAIDs.  ----------------------------------------- 5:03 PM on 07/13/2019 -----------------------------------------  The CTs are negative for acute  findings.  Given the gradual onset of the symptoms and reassuring neurologic exam, there is no indication for lumbar puncture or further work-up.  The patient had not yet received her medication at the time of my reassessment.  I signed her out to the oncoming physician Dr. Jimmye Norman.  ____________________________________________   FINAL CLINICAL IMPRESSION(S) / ED DIAGNOSES  Final diagnoses:  Nonintractable headache, unspecified chronicity pattern, unspecified headache type      NEW MEDICATIONS STARTED DURING THIS VISIT:  New Prescriptions   No medications on file     Note:  This document was prepared using Dragon voice recognition software and may include unintentional dictation errors.    Arta Silence, MD 07/13/19 1704

## 2019-07-13 NOTE — ED Notes (Addendum)
Pt appears to be resting comfortably at this time. Respirations even and unlabored 

## 2019-07-13 NOTE — ED Notes (Signed)
E-signature not working at this time. Pt verbalized understanding of D/C instructions, prescriptions and follow up care with no further questions at this time. Pt in NAD and ambulatory at time of D/C.  

## 2019-08-30 ENCOUNTER — Other Ambulatory Visit: Payer: Self-pay

## 2019-08-30 ENCOUNTER — Encounter: Payer: Self-pay | Admitting: Ophthalmology

## 2019-09-04 ENCOUNTER — Other Ambulatory Visit
Admission: RE | Admit: 2019-09-04 | Discharge: 2019-09-04 | Disposition: A | Discharge status: Home / Self Care | Payer: Medicare Other | Source: Ambulatory Visit | Attending: Ophthalmology | Admitting: Ophthalmology

## 2019-09-04 ENCOUNTER — Other Ambulatory Visit: Payer: Self-pay

## 2019-09-04 DIAGNOSIS — Z20822 Contact with and (suspected) exposure to covid-19: Secondary | ICD-10-CM | POA: Insufficient documentation

## 2019-09-04 DIAGNOSIS — Z01812 Encounter for preprocedural laboratory examination: Secondary | ICD-10-CM | POA: Diagnosis present

## 2019-09-04 LAB — SARS CORONAVIRUS 2 (TAT 6-24 HRS): SARS Coronavirus 2: NEGATIVE

## 2019-09-04 NOTE — Discharge Instructions (Signed)

## 2019-09-06 ENCOUNTER — Encounter
Admission: RE | Disposition: A | Discharge status: Home / Self Care | Payer: Self-pay | Source: Home / Self Care | Attending: Ophthalmology

## 2019-09-06 ENCOUNTER — Ambulatory Visit: Payer: Medicare Other | Admitting: Anesthesiology

## 2019-09-06 ENCOUNTER — Other Ambulatory Visit: Payer: Self-pay

## 2019-09-06 ENCOUNTER — Encounter: Payer: Self-pay | Admitting: Ophthalmology

## 2019-09-06 ENCOUNTER — Ambulatory Visit
Admission: RE | Admit: 2019-09-06 | Discharge: 2019-09-06 | Disposition: A | Discharge status: Home / Self Care | Payer: Medicare Other | Attending: Ophthalmology | Admitting: Ophthalmology

## 2019-09-06 DIAGNOSIS — K449 Diaphragmatic hernia without obstruction or gangrene: Secondary | ICD-10-CM | POA: Insufficient documentation

## 2019-09-06 DIAGNOSIS — Z886 Allergy status to analgesic agent status: Secondary | ICD-10-CM | POA: Insufficient documentation

## 2019-09-06 DIAGNOSIS — K219 Gastro-esophageal reflux disease without esophagitis: Secondary | ICD-10-CM | POA: Diagnosis not present

## 2019-09-06 DIAGNOSIS — Z79899 Other long term (current) drug therapy: Secondary | ICD-10-CM | POA: Diagnosis not present

## 2019-09-06 DIAGNOSIS — M1990 Unspecified osteoarthritis, unspecified site: Secondary | ICD-10-CM | POA: Insufficient documentation

## 2019-09-06 DIAGNOSIS — M109 Gout, unspecified: Secondary | ICD-10-CM | POA: Diagnosis not present

## 2019-09-06 DIAGNOSIS — F419 Anxiety disorder, unspecified: Secondary | ICD-10-CM | POA: Insufficient documentation

## 2019-09-06 DIAGNOSIS — G43909 Migraine, unspecified, not intractable, without status migrainosus: Secondary | ICD-10-CM | POA: Diagnosis not present

## 2019-09-06 DIAGNOSIS — H2511 Age-related nuclear cataract, right eye: Secondary | ICD-10-CM | POA: Insufficient documentation

## 2019-09-06 DIAGNOSIS — Z885 Allergy status to narcotic agent status: Secondary | ICD-10-CM | POA: Insufficient documentation

## 2019-09-06 DIAGNOSIS — Z87891 Personal history of nicotine dependence: Secondary | ICD-10-CM | POA: Insufficient documentation

## 2019-09-06 DIAGNOSIS — I10 Essential (primary) hypertension: Secondary | ICD-10-CM | POA: Insufficient documentation

## 2019-09-06 HISTORY — DX: Other specified postprocedural states: R11.2

## 2019-09-06 HISTORY — DX: Presence of dental prosthetic device (complete) (partial): Z97.2

## 2019-09-06 HISTORY — DX: Migraine, unspecified, not intractable, without status migrainosus: G43.909

## 2019-09-06 HISTORY — DX: Unspecified osteoarthritis, unspecified site: M19.90

## 2019-09-06 HISTORY — DX: Other specified postprocedural states: Z98.890

## 2019-09-06 HISTORY — PX: CATARACT EXTRACTION W/PHACO: SHX586

## 2019-09-06 HISTORY — DX: Family history of other specified conditions: Z84.89

## 2019-09-06 SURGERY — PHACOEMULSIFICATION, CATARACT, WITH IOL INSERTION
Anesthesia: Monitor Anesthesia Care | Site: Eye | Laterality: Right

## 2019-09-06 MED ORDER — MIDAZOLAM HCL 2 MG/2ML IJ SOLN
INTRAMUSCULAR | Status: DC | PRN
  Administered 2019-09-06: 1 mg via INTRAVENOUS

## 2019-09-06 MED ORDER — TETRACAINE HCL 0.5 % OP SOLN
1.0000 [drp] | OPHTHALMIC | Status: DC | PRN
  Administered 2019-09-06 (×3): 1 [drp] via OPHTHALMIC

## 2019-09-06 MED ORDER — ARMC OPHTHALMIC DILATING DROPS
1.0000 "application " | OPHTHALMIC | Status: DC | PRN
  Administered 2019-09-06 (×3): 1 via OPHTHALMIC

## 2019-09-06 MED ORDER — FENTANYL CITRATE (PF) 100 MCG/2ML IJ SOLN
INTRAMUSCULAR | Status: DC | PRN
  Administered 2019-09-06: 50 ug via INTRAVENOUS

## 2019-09-06 MED ORDER — EPINEPHRINE PF 1 MG/ML IJ SOLN
INTRAOCULAR | Status: DC | PRN
  Administered 2019-09-06: 67 mL via OPHTHALMIC

## 2019-09-06 MED ORDER — TETRACAINE 0.5 % OP SOLN OPTIME - NO CHARGE
OPHTHALMIC | Status: DC | PRN
  Administered 2019-09-06: 2 [drp] via OPHTHALMIC

## 2019-09-06 MED ORDER — PROVISC 10 MG/ML IO SOLN
INTRAOCULAR | Status: DC | PRN
  Administered 2019-09-06: 0.55 mL via INTRAOCULAR

## 2019-09-06 MED ORDER — LIDOCAINE HCL (PF) 2 % IJ SOLN
INTRAOCULAR | Status: DC | PRN
  Administered 2019-09-06: 1 mL via INTRAOCULAR

## 2019-09-06 MED ORDER — BRIMONIDINE TARTRATE-TIMOLOL 0.2-0.5 % OP SOLN
OPHTHALMIC | Status: DC | PRN
  Administered 2019-09-06: 1 [drp] via OPHTHALMIC

## 2019-09-06 MED ORDER — MOXIFLOXACIN HCL 0.5 % OP SOLN
OPHTHALMIC | Status: DC | PRN
  Administered 2019-09-06: 0.2 mL via OPHTHALMIC

## 2019-09-06 MED ORDER — TRYPAN BLUE 0.06 % OP SOLN
OPHTHALMIC | Status: DC | PRN
  Administered 2019-09-06: 0.5 mL via INTRAOCULAR

## 2019-09-06 MED ORDER — ONDANSETRON HCL 4 MG/2ML IJ SOLN: 4.0000 mg | Freq: Once | INTRAMUSCULAR | Status: DC | PRN

## 2019-09-06 MED ORDER — NA CHONDROIT SULF-NA HYALURON 40-17 MG/ML IO SOLN
INTRAOCULAR | Status: DC | PRN
  Administered 2019-09-06: 1 mL via INTRAOCULAR

## 2019-09-06 SURGICAL SUPPLY — 18 items
DISSECTOR HYDRO NUCLEUS 50X22 (MISCELLANEOUS) ×12 IMPLANT
DRSG TEGADERM 2-3/8X2-3/4 SM (GAUZE/BANDAGES/DRESSINGS) ×3 IMPLANT
GLOVE BIOGEL PI IND STRL 8 (GLOVE) ×1 IMPLANT
GLOVE BIOGEL PI INDICATOR 8 (GLOVE) ×2
GOWN STRL REUS W/ TWL LRG LVL3 (GOWN DISPOSABLE) ×1 IMPLANT
GOWN STRL REUS W/ TWL XL LVL3 (GOWN DISPOSABLE) ×1 IMPLANT
GOWN STRL REUS W/TWL LRG LVL3 (GOWN DISPOSABLE) ×2
GOWN STRL REUS W/TWL XL LVL3 (GOWN DISPOSABLE) ×2
KNIFE 45D UP 2.3 (MISCELLANEOUS) ×3 IMPLANT
LENS IOL DIOP 22.5 (Intraocular Lens) ×3 IMPLANT
LENS IOL TECNIS MONO 22.5 (Intraocular Lens) IMPLANT
MARKER SKIN DUAL TIP RULER LAB (MISCELLANEOUS) ×2 IMPLANT
PACK CATARACT (MISCELLANEOUS) ×3 IMPLANT
PACK DR. KING ARMS (PACKS) ×3 IMPLANT
PACK EYE AFTER SURG (MISCELLANEOUS) ×3 IMPLANT
SOLUTION OPHTHALMIC SALT (MISCELLANEOUS) ×3 IMPLANT
WATER STERILE IRR 250ML POUR (IV SOLUTION) ×3 IMPLANT
WIPE NON LINTING 3.25X3.25 (MISCELLANEOUS) ×3 IMPLANT

## 2019-09-06 NOTE — Op Note (Signed)
  PREOPERATIVE DIAGNOSIS:  Nuclear sclerotic cataract of the RIGHT eye.   POSTOPERATIVE DIAGNOSIS:  Nuclear sclerotic cataract of the RIGHT eye.   OPERATIVE PROCEDURE: Cataract surgery OD   SURGEON:  Marchia Meiers, MD.   ANESTHESIA:  Anesthesiologist: Veda Canning, MD CRNA: Cameron Ali, CRNA  1.      Managed anesthesia care. 2.     0.7ml of Shugarcaine was instilled following the paracentesis   COMPLICATIONS:  None.   TECHNIQUE:   Divide and conquer   DESCRIPTION OF PROCEDURE:  The patient was examined and consented in the preoperative holding area where the aforementioned topical anesthesia was applied to the RIGHT eye and then brought back to the Operating Room where the RIGHT eye was prepped and draped in the usual sterile ophthalmic fashion and a lid speculum was placed. A paracentesis was created with the side port blade, the anterior chamber was washed out with trypan blue to stain the anterior capsule, and the anterior chamber was filled with viscoelastic. A near clear corneal incision was performed with the steel keratome. A continuous curvilinear capsulorrhexis was performed with a cystotome followed by the capsulorrhexis forceps. Hydrodissection and hydrodelineation were carried out with BSS on a blunt cannula. The lens was removed in a divide and conquer  technique and the remaining cortical material was removed with the irrigation-aspiration handpiece. The capsular bag was inflated with viscoelastic and the lens was placed in the capsular bag without complication. The remaining viscoelastic was removed from the eye with the irrigation-aspiration handpiece. The wounds were hydrated. The anterior chamber was flushed and the eye was inflated to physiologic pressure. 0.47ml Vigamox was placed in the anterior chamber. The wounds were found to be water tight. The eye was dressed with Vigamox. The patient was given protective glasses to wear throughout the day and a shield with which to  sleep tonight. The patient was also given drops with which to begin a drop regimen today and will follow-up with me in one day. Implant Name Type Inv. Item Serial No. Manufacturer Lot No. LRB No. Used Action  LENS IOL DIOP 22.5 - [id number] Intraocular Lens LENS IOL DIOP 22.5 [id number]  Right 1 Implanted    Procedure(s): CATARACT EXTRACTION PHACO AND INTRAOCULAR LENS PLACEMENT (IOC) RIGHT 5.38  00:34.8 (Right)  Electronically signed: Marchia Meiers 09/06/2019 12:40 PM

## 2019-09-06 NOTE — Anesthesia Procedure Notes (Signed)
Procedure Name: MAC Date/Time: 09/06/2019 12:12 PM Performed by: Cameron Ali, CRNA Pre-anesthesia Checklist: Patient identified, Emergency Drugs available, Suction available, Timeout performed and Patient being monitored Patient Re-evaluated:Patient Re-evaluated prior to induction Oxygen Delivery Method: Nasal cannula Placement Confirmation: positive ETCO2

## 2019-09-06 NOTE — Anesthesia Postprocedure Evaluation (Signed)
Anesthesia Post Note  Patient: [name]  Procedure(s) Performed: CATARACT EXTRACTION PHACO AND INTRAOCULAR LENS PLACEMENT (IOC) RIGHT 5.38  00:34.8 (Right Eye)     Patient location during evaluation: PACU Anesthesia Type: MAC Level of consciousness: awake Pain management: pain level controlled Vital Signs Assessment: post-procedure vital signs reviewed and stable Respiratory status: respiratory function stable Cardiovascular status: stable Postop Assessment: no apparent nausea or vomiting Anesthetic complications: no    Veda Canning

## 2019-09-06 NOTE — H&P (Signed)
   I have reviewed the patient's H&P and agree with its findings. There have been no interval changes.  Jayana Kotula MD Ophthalmology 

## 2019-09-06 NOTE — Transfer of Care (Signed)
Immediate Anesthesia Transfer of Care Note  Patient: [name]  Procedure(s) Performed: CATARACT EXTRACTION PHACO AND INTRAOCULAR LENS PLACEMENT (IOC) RIGHT 5.38  00:34.8 (Right Eye)  Patient Location: PACU  Anesthesia Type: MAC  Level of Consciousness: awake, alert  and patient cooperative  Airway and Oxygen Therapy: Patient Spontanous Breathing and Patient connected to supplemental oxygen  Post-op Assessment: Post-op Vital signs reviewed, Patient's Cardiovascular Status Stable, Respiratory Function Stable, Patent Airway and No signs of Nausea or vomiting  Post-op Vital Signs: Reviewed and stable  Complications: No apparent anesthesia complications

## 2019-09-06 NOTE — Anesthesia Preprocedure Evaluation (Signed)
Anesthesia Evaluation  Patient identified by MRN, date of birth, ID band Patient awake    Reviewed: Allergy & Precautions, NPO status , Patient's Chart, lab work & pertinent test results  History of Anesthesia Complications (+) PONV and history of anesthetic complications  Airway Mallampati: II  TM Distance: >3 FB     Dental   Pulmonary    breath sounds clear to auscultation       Cardiovascular hypertension,  Rhythm:Regular Rate:Normal     Neuro/Psych  Headaches,    GI/Hepatic GERD  ,  Endo/Other    Renal/GU      Musculoskeletal  (+) Arthritis ,   Abdominal   Peds  Hematology   Anesthesia Other Findings   Reproductive/Obstetrics                             Anesthesia Physical Anesthesia Plan  ASA: II  Anesthesia Plan: MAC   Post-op Pain Management:    Induction: Intravenous  PONV Risk Score and Plan: TIVA, Midazolam and Treatment may vary due to age or medical condition  Airway Management Planned: Natural Airway and Nasal Cannula  Additional Equipment:   Intra-op Plan:   Post-operative Plan:   Informed Consent: I have reviewed the patients History and Physical, chart, labs and discussed the procedure including the risks, benefits and alternatives for the proposed anesthesia with the patient or authorized representative who has indicated his/her understanding and acceptance.       Plan Discussed with: CRNA  Anesthesia Plan Comments:         Anesthesia Quick Evaluation

## 2019-09-07 ENCOUNTER — Encounter: Payer: Self-pay | Admitting: *Deleted

## 2019-09-21 ENCOUNTER — Other Ambulatory Visit: Payer: Self-pay

## 2019-09-21 ENCOUNTER — Encounter: Payer: Self-pay | Admitting: Ophthalmology

## 2019-09-26 ENCOUNTER — Other Ambulatory Visit
Admission: RE | Admit: 2019-09-26 | Discharge: 2019-09-26 | Disposition: A | Discharge status: Home / Self Care | Payer: Medicare Other | Source: Ambulatory Visit | Attending: Ophthalmology | Admitting: Ophthalmology

## 2019-09-26 ENCOUNTER — Other Ambulatory Visit: Payer: Self-pay

## 2019-09-26 DIAGNOSIS — Z20822 Contact with and (suspected) exposure to covid-19: Secondary | ICD-10-CM | POA: Insufficient documentation

## 2019-09-26 DIAGNOSIS — Z01812 Encounter for preprocedural laboratory examination: Secondary | ICD-10-CM | POA: Insufficient documentation

## 2019-09-26 LAB — SARS CORONAVIRUS 2 (TAT 6-24 HRS): SARS Coronavirus 2: NEGATIVE

## 2019-09-26 NOTE — Discharge Instructions (Signed)

## 2019-09-28 ENCOUNTER — Ambulatory Visit: Payer: Medicare Other | Admitting: Anesthesiology

## 2019-09-28 ENCOUNTER — Ambulatory Visit
Admission: RE | Admit: 2019-09-28 | Discharge: 2019-09-28 | Disposition: A | Discharge status: Home / Self Care | Payer: Medicare Other | Source: Ambulatory Visit | Attending: Ophthalmology | Admitting: Ophthalmology

## 2019-09-28 ENCOUNTER — Encounter: Payer: Self-pay | Admitting: Ophthalmology

## 2019-09-28 ENCOUNTER — Other Ambulatory Visit: Payer: Self-pay

## 2019-09-28 ENCOUNTER — Encounter
Admission: RE | Disposition: A | Discharge status: Home / Self Care | Payer: Self-pay | Source: Ambulatory Visit | Attending: Ophthalmology

## 2019-09-28 DIAGNOSIS — M1990 Unspecified osteoarthritis, unspecified site: Secondary | ICD-10-CM | POA: Insufficient documentation

## 2019-09-28 DIAGNOSIS — Z79899 Other long term (current) drug therapy: Secondary | ICD-10-CM | POA: Diagnosis not present

## 2019-09-28 DIAGNOSIS — K219 Gastro-esophageal reflux disease without esophagitis: Secondary | ICD-10-CM | POA: Diagnosis not present

## 2019-09-28 DIAGNOSIS — Z886 Allergy status to analgesic agent status: Secondary | ICD-10-CM | POA: Diagnosis not present

## 2019-09-28 DIAGNOSIS — Z87891 Personal history of nicotine dependence: Secondary | ICD-10-CM | POA: Insufficient documentation

## 2019-09-28 DIAGNOSIS — H2512 Age-related nuclear cataract, left eye: Secondary | ICD-10-CM | POA: Diagnosis present

## 2019-09-28 DIAGNOSIS — I10 Essential (primary) hypertension: Secondary | ICD-10-CM | POA: Insufficient documentation

## 2019-09-28 DIAGNOSIS — Z885 Allergy status to narcotic agent status: Secondary | ICD-10-CM | POA: Diagnosis not present

## 2019-09-28 HISTORY — PX: CATARACT EXTRACTION W/PHACO: SHX586

## 2019-09-28 SURGERY — PHACOEMULSIFICATION, CATARACT, WITH IOL INSERTION
Anesthesia: Monitor Anesthesia Care | Site: Eye | Laterality: Left

## 2019-09-28 MED ORDER — NA CHONDROIT SULF-NA HYALURON 40-17 MG/ML IO SOLN
INTRAOCULAR | Status: DC | PRN
  Administered 2019-09-28: 1 mL via INTRAOCULAR

## 2019-09-28 MED ORDER — FENTANYL CITRATE (PF) 100 MCG/2ML IJ SOLN
INTRAMUSCULAR | Status: DC | PRN
  Administered 2019-09-28: 50 ug via INTRAVENOUS

## 2019-09-28 MED ORDER — LIDOCAINE HCL (PF) 2 % IJ SOLN
INTRAOCULAR | Status: DC | PRN
  Administered 2019-09-28: 2 mL via INTRAOCULAR

## 2019-09-28 MED ORDER — MOXIFLOXACIN HCL 0.5 % OP SOLN
OPHTHALMIC | Status: DC | PRN
  Administered 2019-09-28: 0.2 mL via OPHTHALMIC

## 2019-09-28 MED ORDER — TETRACAINE 0.5 % OP SOLN OPTIME - NO CHARGE
OPHTHALMIC | Status: DC | PRN
  Administered 2019-09-28: 2 [drp] via OPHTHALMIC

## 2019-09-28 MED ORDER — EPINEPHRINE PF 1 MG/ML IJ SOLN
INTRAOCULAR | Status: DC | PRN
  Administered 2019-09-28: 78 mL via OPHTHALMIC

## 2019-09-28 MED ORDER — TETRACAINE HCL 0.5 % OP SOLN
1.0000 [drp] | OPHTHALMIC | Status: DC | PRN
  Administered 2019-09-28 (×3): 1 [drp] via OPHTHALMIC

## 2019-09-28 MED ORDER — ARMC OPHTHALMIC DILATING DROPS
1.0000 "application " | OPHTHALMIC | Status: DC | PRN
  Administered 2019-09-28 (×3): 1 via OPHTHALMIC

## 2019-09-28 MED ORDER — MIDAZOLAM HCL 2 MG/2ML IJ SOLN
INTRAMUSCULAR | Status: DC | PRN
  Administered 2019-09-28: 1 mg via INTRAVENOUS

## 2019-09-28 MED ORDER — TRYPAN BLUE 0.06 % OP SOLN
OPHTHALMIC | Status: DC | PRN
  Administered 2019-09-28: 0.5 mL via INTRAOCULAR

## 2019-09-28 MED ORDER — BRIMONIDINE TARTRATE-TIMOLOL 0.2-0.5 % OP SOLN
OPHTHALMIC | Status: DC | PRN
  Administered 2019-09-28: 1 [drp] via OPHTHALMIC

## 2019-09-28 MED ORDER — PROVISC 10 MG/ML IO SOLN
INTRAOCULAR | Status: DC | PRN
  Administered 2019-09-28: 0.55 mL via INTRAOCULAR

## 2019-09-28 SURGICAL SUPPLY — 18 items
DISSECTOR HYDRO NUCLEUS 50X22 (MISCELLANEOUS) ×9 IMPLANT
DRSG TEGADERM 2-3/8X2-3/4 SM (GAUZE/BANDAGES/DRESSINGS) ×3 IMPLANT
GLOVE BIOGEL PI IND STRL 8 (GLOVE) ×1 IMPLANT
GLOVE BIOGEL PI INDICATOR 8 (GLOVE) ×2
GOWN STRL REUS W/ TWL LRG LVL3 (GOWN DISPOSABLE) ×1 IMPLANT
GOWN STRL REUS W/ TWL XL LVL3 (GOWN DISPOSABLE) ×1 IMPLANT
GOWN STRL REUS W/TWL LRG LVL3 (GOWN DISPOSABLE) ×2
GOWN STRL REUS W/TWL XL LVL3 (GOWN DISPOSABLE) ×2
KNIFE 45D UP 2.3 (MISCELLANEOUS) ×3 IMPLANT
LENS IOL DIOP 22.5 (Intraocular Lens) ×3 IMPLANT
LENS IOL TECNIS MONO 22.5 (Intraocular Lens) ×1 IMPLANT
MARKER SKIN DUAL TIP RULER LAB (MISCELLANEOUS) ×3 IMPLANT
PACK CATARACT (MISCELLANEOUS) ×3 IMPLANT
PACK DR. KING ARMS (PACKS) ×3 IMPLANT
PACK EYE AFTER SURG (MISCELLANEOUS) ×3 IMPLANT
SOLUTION OPHTHALMIC SALT (MISCELLANEOUS) ×3 IMPLANT
WATER STERILE IRR 250ML POUR (IV SOLUTION) ×3 IMPLANT
WIPE NON LINTING 3.25X3.25 (MISCELLANEOUS) ×3 IMPLANT

## 2019-09-28 NOTE — Anesthesia Preprocedure Evaluation (Signed)
Anesthesia Evaluation  Patient identified by MRN, date of birth, ID band Patient awake    Reviewed: Allergy & Precautions, H&P , NPO status , Patient's Chart, lab work & pertinent test results, reviewed documented beta blocker date and time   History of Anesthesia Complications (+) PONV and history of anesthetic complications  Airway Mallampati: II  TM Distance: >3 FB Neck ROM: full    Dental  (+) Upper Dentures   Pulmonary neg pulmonary ROS,    Pulmonary exam normal breath sounds clear to auscultation       Cardiovascular Exercise Tolerance: Good hypertension,  Rhythm:regular Rate:Normal     Neuro/Psych  Headaches, negative psych ROS   GI/Hepatic Neg liver ROS, GERD  ,  Endo/Other  negative endocrine ROS  Renal/GU negative Renal ROS  negative genitourinary   Musculoskeletal   Abdominal   Peds  Hematology negative hematology ROS (+)   Anesthesia Other Findings   Reproductive/Obstetrics negative OB ROS                             Anesthesia Physical Anesthesia Plan  ASA: II  Anesthesia Plan: MAC   Post-op Pain Management:    Induction:   PONV Risk Score and Plan: 3 and Treatment may vary due to age or medical condition  Airway Management Planned:   Additional Equipment:   Intra-op Plan:   Post-operative Plan:   Informed Consent: I have reviewed the patients History and Physical, chart, labs and discussed the procedure including the risks, benefits and alternatives for the proposed anesthesia with the patient or authorized representative who has indicated his/her understanding and acceptance.     Dental Advisory Given  Plan Discussed with: CRNA  Anesthesia Plan Comments:         Anesthesia Quick Evaluation

## 2019-09-28 NOTE — H&P (Signed)
   I have reviewed the patient's H&P and agree with its findings. There have been no interval changes.  Theora Vankirk MD Ophthalmology 

## 2019-09-28 NOTE — Anesthesia Procedure Notes (Signed)
Procedure Name: Id[place] Performed by: Cameron Ali, CRNA Pre-anesthesia Checklist: Patient identified, Emergency Drugs available, Suction available, Timeout performed and Patient being monitored Patient Re-evaluated:Patient Re-evaluated prior to induction Oxygen Delivery Method: Nasal cannula Placement Confirmation: positive ETCO2

## 2019-09-28 NOTE — Op Note (Signed)
  PREOPERATIVE DIAGNOSIS:  Nuclear sclerotic cataract of the LEFT eye.   POSTOPERATIVE DIAGNOSIS:  Nuclear sclerotic cataract of the LEFT eye.   OPERATIVE PROCEDURE: Cataract surgery OS   SURGEON:  Marchia Meiers, MD.   ANESTHESIA:  Anesthesiologist: Alisa Graff, MD CRNA: Mayme Genta, CRNA; Cameron Ali, CRNA  1.      Managed anesthesia care. 2.     0.52ml of Shugarcaine was instilled following the paracentesis   COMPLICATIONS:  None.   TECHNIQUE:   Divide and conquer   DESCRIPTION OF PROCEDURE:  The patient was examined and consented in the preoperative holding area where the aforementioned topical anesthesia was applied to the LEFT eye and then brought back to the Operating Room where the left eye was prepped and draped in the usual sterile ophthalmic fashion and a lid speculum was placed. A paracentesis was created with the side port blade, the anterior chamber was washed out with trypan blue to stain the anterior capsule, and the anterior chamber was filled with viscoelastic. A near clear corneal incision was performed with the steel keratome. A continuous curvilinear capsulorrhexis was performed with a cystotome followed by the capsulorrhexis forceps. Hydrodissection and hydrodelineation were carried out with BSS on a blunt cannula. The lens was removed in a divide and conquer  technique and the remaining cortical material was removed with the irrigation-aspiration handpiece. The capsular bag was inflated with viscoelastic and the lens was placed in the capsular bag without complication. The remaining viscoelastic was removed from the eye with the irrigation-aspiration handpiece. The wounds were hydrated. The anterior chamber was flushed and the eye was inflated to physiologic pressure. 0.25ml Vigamox was placed in the anterior chamber. The wounds were found to be water tight. The eye was dressed with Vigamox. The patient was given protective glasses to wear throughout the day and a  shield with which to sleep tonight. The patient was also given drops with which to begin a drop regimen today and will follow-up with me in one day. Implant Name Type Inv. Item Serial No. Manufacturer Lot No. LRB No. Used Action  LENS IOL DIOP 22.5 - [id number] Intraocular Lens LENS IOL DIOP 22.5 [id number]  Left 1 Implanted    Procedure(s): CATARACT EXTRACTION PHACO AND INTRAOCULAR LENS PLACEMENT (IOC) LEFT 9.31  00:57.5 (Left)  Electronically signed: Faithlynn Deeley 09/28/2019 10:40 AM

## 2019-09-28 NOTE — Transfer of Care (Signed)
Immediate Anesthesia Transfer of Care Note  Patient: [name]  Procedure(s) Performed: CATARACT EXTRACTION PHACO AND INTRAOCULAR LENS PLACEMENT (IOC) LEFT 9.31  00:57.5 (Left Eye)  Patient Location: PACU  Anesthesia Type: MAC  Level of Consciousness: awake, alert  and patient cooperative  Airway and Oxygen Therapy: Patient Spontanous Breathing and Patient connected to supplemental oxygen  Post-op Assessment: Post-op Vital signs reviewed, Patient's Cardiovascular Status Stable, Respiratory Function Stable, Patent Airway and No signs of Nausea or vomiting  Post-op Vital Signs: Reviewed and stable  Complications: No complications documented.

## 2019-09-28 NOTE — Anesthesia Postprocedure Evaluation (Signed)
Anesthesia Post Note  Patient: [name]  Procedure(s) Performed: CATARACT EXTRACTION PHACO AND INTRAOCULAR LENS PLACEMENT (IOC) LEFT 9.31  00:57.5 (Left Eye)     Patient location during evaluation: PACU Anesthesia Type: MAC Level of consciousness: awake and alert Pain management: pain level controlled Vital Signs Assessment: post-procedure vital signs reviewed and stable Respiratory status: spontaneous breathing, nonlabored ventilation, respiratory function stable and patient connected to nasal cannula oxygen Cardiovascular status: stable and blood pressure returned to baseline Postop Assessment: no apparent nausea or vomiting Anesthetic complications: no   No complications documented.  Alisa Graff

## 2019-09-29 ENCOUNTER — Encounter: Payer: Self-pay | Admitting: Ophthalmology

## 2019-11-17 DIAGNOSIS — E0590 Thyrotoxicosis, unspecified without thyrotoxic crisis or storm: Secondary | ICD-10-CM | POA: Insufficient documentation

## 2020-07-17 ENCOUNTER — Other Ambulatory Visit: Payer: Self-pay | Admitting: Obstetrics and Gynecology

## 2020-07-17 DIAGNOSIS — Z1231 Encounter for screening mammogram for malignant neoplasm of breast: Secondary | ICD-10-CM

## 2020-08-05 ENCOUNTER — Ambulatory Visit: Payer: Medicare Other | Admitting: Gastroenterology

## 2020-08-06 ENCOUNTER — Ambulatory Visit: Payer: Medicare Other | Admitting: Gastroenterology

## 2020-08-08 ENCOUNTER — Ambulatory Visit
Admission: RE | Admit: 2020-08-08 | Discharge: 2020-08-08 | Disposition: A | Discharge status: Home / Self Care | Payer: Medicare HMO | Source: Ambulatory Visit | Attending: Obstetrics and Gynecology | Admitting: Obstetrics and Gynecology

## 2020-08-08 ENCOUNTER — Other Ambulatory Visit: Payer: Self-pay

## 2020-08-08 DIAGNOSIS — Z1231 Encounter for screening mammogram for malignant neoplasm of breast: Secondary | ICD-10-CM | POA: Insufficient documentation

## 2020-08-09 DIAGNOSIS — Z96652 Presence of left artificial knee joint: Secondary | ICD-10-CM | POA: Insufficient documentation

## 2020-08-09 DIAGNOSIS — Z0000 Encounter for general adult medical examination without abnormal findings: Secondary | ICD-10-CM | POA: Insufficient documentation

## 2020-08-09 DIAGNOSIS — I10 Essential (primary) hypertension: Secondary | ICD-10-CM | POA: Insufficient documentation

## 2020-09-05 ENCOUNTER — Other Ambulatory Visit: Payer: Self-pay

## 2020-09-05 ENCOUNTER — Encounter: Payer: Self-pay | Admitting: Gastroenterology

## 2020-09-05 ENCOUNTER — Ambulatory Visit (INDEPENDENT_AMBULATORY_CARE_PROVIDER_SITE_OTHER): Payer: Medicare HMO | Admitting: Gastroenterology

## 2020-09-05 VITALS — BP 155/64 | HR 63 | Ht 63.0 in | Wt 206.4 lb

## 2020-09-05 DIAGNOSIS — Z800 Family history of malignant neoplasm of digestive organs: Secondary | ICD-10-CM

## 2020-09-05 DIAGNOSIS — R109 Unspecified abdominal pain: Secondary | ICD-10-CM | POA: Diagnosis not present

## 2020-09-05 DIAGNOSIS — K219 Gastro-esophageal reflux disease without esophagitis: Secondary | ICD-10-CM

## 2020-09-05 MED ORDER — NA SULFATE-K SULFATE-MG SULF 17.5-3.13-1.6 GM/177ML PO SOLN
1.0000 | Freq: Once | ORAL | 0 refills | Status: AC
Start: 2020-09-05 — End: 2020-09-05

## 2020-09-05 NOTE — Patient Instructions (Signed)
Gastroesophageal Reflux Disease, Adult  Gastroesophageal reflux (GER) happens when acid from the stomach flows up into the tube that connects the mouth and the stomach (esophagus). Normally, food travels down the esophagus and stays in the stomach to be digested. With GER, food and stomach acid sometimes move back up into the esophagus. You may have a disease called gastroesophageal reflux disease (GERD) if the reflux:  Happens often.  Causes frequent or very bad symptoms.  Causes problems such as damage to the esophagus. When this happens, the esophagus becomes sore and swollen. Over time, GERD can make small holes (ulcers) in the lining of the esophagus. What are the causes? This condition is caused by a problem with the muscle between the esophagus and the stomach. When this muscle is weak or not normal, it does not close properly to keep food and acid from coming back up from the stomach. The muscle can be weak because of:  Tobacco use.  Pregnancy.  Having a certain type of hernia (hiatal hernia).  Alcohol use.  Certain foods and drinks, such as coffee, chocolate, onions, and peppermint. What increases the risk?  Being overweight.  Having a disease that affects your connective tissue.  Taking NSAIDs, such a ibuprofen. What are the signs or symptoms?  Heartburn.  Difficult or painful swallowing.  The feeling of having a lump in the throat.  A bitter taste in the mouth.  Bad breath.  Having a lot of saliva.  Having an upset or bloated stomach.  Burping.  Chest pain. Different conditions can cause chest pain. Make sure you see your doctor if you have chest pain.  Shortness of breath or wheezing.  A long-term cough or a cough at night.  Wearing away of the surface of teeth (tooth enamel).  Weight loss. How is this treated?  Making changes to your diet.  Taking medicine.  Having surgery. Treatment will depend on how bad your symptoms are. Follow these  instructions at home: Eating and drinking  Follow a diet as told by your doctor. You may need to avoid foods and drinks such as: ? Coffee and tea, with or without caffeine. ? Drinks that contain alcohol. ? Energy drinks and sports drinks. ? Bubbly (carbonated) drinks or sodas. ? Chocolate and cocoa. ? Peppermint and mint flavorings. ? Garlic and onions. ? Horseradish. ? Spicy and acidic foods. These include peppers, chili powder, curry powder, vinegar, hot sauces, and BBQ sauce. ? Citrus fruit juices and citrus fruits, such as oranges, lemons, and limes. ? Tomato-based foods. These include red sauce, chili, salsa, and pizza with red sauce. ? Fried and fatty foods. These include donuts, french fries, potato chips, and high-fat dressings. ? High-fat meats. These include hot dogs, rib eye steak, sausage, ham, and bacon. ? High-fat dairy items, such as whole milk, butter, and cream cheese.  Eat small meals often. Avoid eating large meals.  Avoid drinking large amounts of liquid with your meals.  Avoid eating meals during the 2-3 hours before bedtime.  Avoid lying down right after you eat.  Do not exercise right after you eat.   Lifestyle  Do not smoke or use any products that contain nicotine or tobacco. If you need help quitting, ask your doctor.  Try to lower your stress. If you need help doing this, ask your doctor.  If you are overweight, lose an amount of weight that is healthy for you. Ask your doctor about a safe weight loss goal.   General instructions  Pay attention to any changes in your symptoms.  Take over-the-counter and prescription medicines only as told by your doctor.  Do not take aspirin, ibuprofen, or other NSAIDs unless your doctor says it is okay.  Wear loose clothes. Do not wear anything tight around your waist.  Raise (elevate) the head of your bed about 6 inches (15 cm). You may need to use a wedge to do this.  Avoid bending over if this makes your  symptoms worse.  Keep all follow-up visits. Contact a doctor if:  You have new symptoms.  You lose weight and you do not know why.  You have trouble swallowing or it hurts to swallow.  You have wheezing or a cough that keeps happening.  You have a hoarse voice.  Your symptoms do not get better with treatment. Get help right away if:  You have sudden pain in your arms, neck, jaw, teeth, or back.  You suddenly feel sweaty, dizzy, or light-headed.  You have chest pain or shortness of breath.  You vomit and the vomit is green, yellow, or black, or it looks like blood or coffee grounds.  You faint.  Your poop (stool) is red, bloody, or black.  You cannot swallow, drink, or eat. These symptoms may represent a serious problem that is an emergency. Do not wait to see if the symptoms will go away. Get medical help right away. Call your local emergency services (911 in the [place] Do not drive yourself to the hospital. Summary  If a person has gastroesophageal reflux disease (GERD), food and stomach acid move back up into the esophagus and cause symptoms or problems such as damage to the esophagus.  Treatment will depend on how bad your symptoms are.  Follow a diet as told by your doctor.  Take all medicines only as told by your doctor. This information is not intended to replace advice given to you by your health care provider. Make sure you discuss any questions you have with your health care provider. Document Revised: 10/09/2019 Document Reviewed: 10/09/2019 Elsevier Patient Education  [place].

## 2020-09-05 NOTE — Progress Notes (Signed)
Jonathon Bellows MD, MRCP(U.K) [address]  [place]  [place], [place] [address]  Main: [phone]  Fax: [phone]   Gastroenterology Consultation  Referring Provider:     Josephine Cables, MD Primary Care Physician:  Rusty Aus, MD Primary Gastroenterologist:  Dr. Jonathon Bellows  Reason for Consultation:     Acid reflux and colon cancer screening        HPI:   [name] is a 70 y.o. y/o female referred for consultation & management  by Dr. Sabra Heck, Christean Grief, MD.    Is here today to see me for colon cancer screening and GERD. She states that she has been having abdominal pain for few months into her abdomen usually after meals lasting the whole day.  Describes it as a cramp.  Nonradiating.  Not relieved after bowel movement.  Not on any NSAID use.  Takes Protonix 20 mg for thing in the morning on empty stomach.  That helps with the reflux.  No unintentional weight loss.  Denies any lower GI symptoms including diarrhea change in bowel movements or rectal bleeding.   She had her last colonoscopy in November 2016 by Dr. Candace Cruise, no polyps were seen but the colonoscopy withdrawal time was less than 3 minutes.  Family history of colon cancer.     Past Medical History:  Diagnosis Date  . Arthritis   . Family history of adverse reaction to anesthesia    Sister - PONV  . GERD (gastroesophageal reflux disease)   . Hypertension   . Migraine headache    had one 07/13/19 after approx 40 years without one  . PONV (postoperative nausea and vomiting)   . Wears dentures    Full upper    Past Surgical History:  Procedure Laterality Date  . ABDOMINAL HYSTERECTOMY    . CATARACT EXTRACTION W/PHACO Right 09/06/2019   Procedure: CATARACT EXTRACTION PHACO AND INTRAOCULAR LENS PLACEMENT (IOC) RIGHT 5.38  00:34.8;  Surgeon: Marchia Meiers, MD;  Location: [facility];  Service: Ophthalmology;  Laterality: Right;  . CATARACT EXTRACTION W/PHACO Left 09/28/2019   Procedure: CATARACT  EXTRACTION PHACO AND INTRAOCULAR LENS PLACEMENT (IOC) LEFT 9.31  00:57.5;  Surgeon: Marchia Meiers, MD;  Location: [facility];  Service: Ophthalmology;  Laterality: Left;  . CESAREAN SECTION    . COLONOSCOPY N/A 02/22/2015   Procedure: COLONOSCOPY;  Surgeon: Hulen Luster, MD;  Location: [facility] ENDOSCOPY;  Service: Gastroenterology;  Laterality: N/A;  . KNEE ARTHROSCOPY AND ARTHROTOMY    . WRIST SURGERY      Prior to Admission medications   Medication Sig Start Date End Date Taking? Authorizing Provider  acetaminophen (ACETAMINOPHEN 8 HOUR) 650 MG CR tablet Take 650 mg by mouth every 8 (eight) hours as needed for pain.    [provider]  amitriptyline (ELAVIL) 25 MG tablet Take 1 tablet daily by mouth. 04/25/14   [provider]  amLODipine (NORVASC) 5 MG tablet Take 1 tablet daily by mouth.    [provider]  chlorthalidone (HYGROTON) 25 MG tablet Take 25 mg by mouth daily.    [provider]  losartan (COZAAR) 100 MG tablet Take 1 tablet daily by mouth. 01/21/17   [provider]  omeprazole (PRILOSEC) 20 MG capsule Take 20 mg by mouth daily.    [provider]  ondansetron (ZOFRAN ODT) 4 MG disintegrating tablet Take 1 tablet (4 mg total) every 8 (eight) hours as needed by mouth for nausea or vomiting. Patient not taking:  Reported on 08/30/2019 02/19/17   Carrie Mew, MD    Family History  Problem Relation Age of Onset  . Breast cancer Neg Hx      Social History   Tobacco Use  . Smoking status: Never Smoker  . Smokeless tobacco: Never Used  Vaping Use  . Vaping Use: Never used  Substance Use Topics  . Alcohol use: Yes    Alcohol/week: 1.0 standard drink    Types: 1 Standard drinks or equivalent per week    Comment: may have 1 drink per week    Allergies as of 09/05/2020 - Review Complete 09/05/2020  Allergen Reaction Noted  . Aspirin Swelling 02/21/2015  . Codeine Swelling 02/21/2015  . Norco  [hydrocodone-acetaminophen] Itching 02/21/2015  . Percocet [oxycodone-acetaminophen] Hives 02/21/2015  . Adhesive [tape] Rash 08/30/2019    Review of Systems:    All systems reviewed and negative except where noted in HPI.   Physical Exam:  BP (!) 155/64 (BP Location: Left Arm, Patient Position: Sitting, Cuff Size: Large)   Pulse 63   Ht 5\' 3"  (1.6 m)   Wt 206 lb 6.4 oz (93.6 kg)   BMI 36.56 kg/m  No LMP recorded. Patient has had a hysterectomy. Psych:  Alert and cooperative. Normal mood and affect. General:   Alert,  Well-developed, well-nourished, pleasant and cooperative in NAD Head:  Normocephalic and atraumatic. Eyes:  Sclera clear, no icterus.   Conjunctiva pink. Ears:  Normal auditory acuity.ly. Lungs:  Respirations even and unlabored.  Clear throughout to auscultation.   No wheezes, crackles, or rhonchi. No acute distress. Heart:  Regular rate and rhythm; no murmurs, clicks, rubs, or gallops. Abdomen:  Normal bowel sounds.  No bruits.  Soft, non-tender and non-distended without masses, hepatosplenomegaly or hernias noted.  No guarding or rebound tenderness.    Neurologic:  Alert and oriented x3;  grossly normal neurologically. Psych:  Alert and cooperative. Normal mood and affect.  Imaging Studies: MM 3D SCREEN BREAST BILATERAL  Result Date: 08/09/2020 CLINICAL DATA:  Screening. EXAM: DIGITAL SCREENING BILATERAL MAMMOGRAM WITH TOMOSYNTHESIS AND CAD TECHNIQUE: Bilateral screening digital craniocaudal and mediolateral oblique mammograms were obtained. Bilateral screening digital breast tomosynthesis was performed. The images were evaluated with computer-aided detection. COMPARISON:  Previous exam(s). ACR Breast Density Category b: There are scattered areas of fibroglandular density. FINDINGS: There are no findings suspicious for malignancy. The images were evaluated with computer-aided detection. IMPRESSION: No mammographic evidence of malignancy. A result letter of this screening  mammogram will be mailed directly to the patient. RECOMMENDATION: Screening mammogram in one year. (Code:SM-B-01Y) BI-RADS CATEGORY  1: Negative. Electronically Signed   By: Kristopher Oppenheim M.D.   On: 08/09/2020 10:10    Assessment and Plan:   [name] is a 48 y.o. y/o female has been referred for colon cancer screening high risk, father had colon cancer.  GERD   Plan 1.  Colon cancer screening: Family history of colon cancer 2. GERD : Counseled on life style changes, suggest to use PPI first thing in the morning on empty stomach and eat 30 minutes after. Advised on the use of a wedge pillow at night , avoid meals for 2 hours prior to bed time. Weight loss .Discussed the risks and benefits of long term PPI use including but not limited to bone loss, chronic kidney disease, infections , low magnesium . Aim to use at the lowest dose for the shortest period of time  3.  Abdominal pain: If the abdominal discomfort is not  better at the next visit after I have increased the Protonix to 40 mg once a day which she will start today, will consider biliary evaluation and CT scan of the abdomen. I have discussed alternative options, risks & benefits,  which include, but are not limited to, bleeding, infection, perforation,respiratory complication & drug reaction.  The patient agrees with this plan & written consent will be obtained.    Follow up in 8 weeks  Dr Jonathon Bellows MD,MRCP(U.K)

## 2020-10-02 ENCOUNTER — Encounter: Payer: Self-pay | Admitting: Gastroenterology

## 2020-10-03 ENCOUNTER — Other Ambulatory Visit: Payer: Self-pay

## 2020-10-03 ENCOUNTER — Ambulatory Visit: Payer: Medicare HMO | Admitting: Anesthesiology

## 2020-10-03 ENCOUNTER — Ambulatory Visit
Admission: RE | Admit: 2020-10-03 | Discharge: 2020-10-03 | Disposition: A | Discharge status: Home / Self Care | Payer: Medicare HMO | Source: Ambulatory Visit | Attending: Gastroenterology | Admitting: Gastroenterology

## 2020-10-03 ENCOUNTER — Encounter: Payer: Self-pay | Admitting: Gastroenterology

## 2020-10-03 ENCOUNTER — Encounter
Admission: RE | Disposition: A | Discharge status: Home / Self Care | Payer: Self-pay | Source: Ambulatory Visit | Attending: Gastroenterology

## 2020-10-03 DIAGNOSIS — Z90710 Acquired absence of both cervix and uterus: Secondary | ICD-10-CM | POA: Insufficient documentation

## 2020-10-03 DIAGNOSIS — R109 Unspecified abdominal pain: Secondary | ICD-10-CM | POA: Diagnosis not present

## 2020-10-03 DIAGNOSIS — Z79899 Other long term (current) drug therapy: Secondary | ICD-10-CM | POA: Insufficient documentation

## 2020-10-03 DIAGNOSIS — K5730 Diverticulosis of large intestine without perforation or abscess without bleeding: Secondary | ICD-10-CM | POA: Diagnosis not present

## 2020-10-03 DIAGNOSIS — Z800 Family history of malignant neoplasm of digestive organs: Secondary | ICD-10-CM | POA: Diagnosis not present

## 2020-10-03 DIAGNOSIS — Z885 Allergy status to narcotic agent status: Secondary | ICD-10-CM | POA: Insufficient documentation

## 2020-10-03 DIAGNOSIS — Z886 Allergy status to analgesic agent status: Secondary | ICD-10-CM | POA: Diagnosis not present

## 2020-10-03 DIAGNOSIS — Z1211 Encounter for screening for malignant neoplasm of colon: Secondary | ICD-10-CM | POA: Diagnosis present

## 2020-10-03 HISTORY — PX: ESOPHAGOGASTRODUODENOSCOPY (EGD) WITH PROPOFOL: SHX5813

## 2020-10-03 HISTORY — PX: COLONOSCOPY WITH PROPOFOL: SHX5780

## 2020-10-03 SURGERY — COLONOSCOPY WITH PROPOFOL
Anesthesia: General

## 2020-10-03 MED ORDER — ONDANSETRON HCL 4 MG/2ML IJ SOLN
INTRAMUSCULAR | Status: DC | PRN
  Administered 2020-10-03: 4 mg via INTRAVENOUS

## 2020-10-03 MED ORDER — PROPOFOL 500 MG/50ML IV EMUL
INTRAVENOUS | Status: DC | PRN
  Administered 2020-10-03: 200 ug/kg/min via INTRAVENOUS

## 2020-10-03 MED ORDER — SODIUM CHLORIDE 0.9 % IV SOLN
INTRAVENOUS | Status: DC
Start: 2020-10-03 — End: 2020-10-03

## 2020-10-03 MED ORDER — PROPOFOL 10 MG/ML IV BOLUS
INTRAVENOUS | Status: DC | PRN
  Administered 2020-10-03: 20 mg via INTRAVENOUS
  Administered 2020-10-03: 30 mg via INTRAVENOUS
  Administered 2020-10-03: 50 mg via INTRAVENOUS

## 2020-10-03 NOTE — Anesthesia Preprocedure Evaluation (Signed)
Anesthesia Evaluation  Patient identified by MRN, date of birth, ID band Patient awake    Reviewed: Allergy & Precautions, H&P , NPO status , Patient's Chart, lab work & pertinent test results, reviewed documented beta blocker date and time   History of Anesthesia Complications (+) PONV, Family history of anesthesia reaction and history of anesthetic complications  Airway Mallampati: II   Neck ROM: full    Dental  (+) Teeth Intact   Pulmonary neg pulmonary ROS,    Pulmonary exam normal        Cardiovascular Exercise Tolerance: Poor hypertension, On Medications negative cardio ROS Normal cardiovascular exam Rhythm:regular Rate:Normal     Neuro/Psych  Headaches, negative psych ROS   GI/Hepatic Neg liver ROS, GERD  Medicated,  Endo/Other  negative endocrine ROS  Renal/GU negative Renal ROS  negative genitourinary   Musculoskeletal   Abdominal   Peds  Hematology negative hematology ROS (+)   Anesthesia Other Findings Past Medical History: No date: Arthritis No date: Family history of adverse reaction to anesthesia     Comment:  Sister - PONV No date: GERD (gastroesophageal reflux disease) No date: Hypertension No date: Migraine headache     Comment:  had one 07/13/19 after approx 40 years without one No date: PONV (postoperative nausea and vomiting) No date: Wears dentures     Comment:  Full upper Past Surgical History: No date: ABDOMINAL HYSTERECTOMY 09/06/2019: CATARACT EXTRACTION W/PHACO; Right     Comment:  Procedure: CATARACT EXTRACTION PHACO AND INTRAOCULAR               LENS PLACEMENT (IOC) RIGHT 5.38  00:34.8;  Surgeon:               Marchia Meiers, MD;  Location: [facility];                Service: Ophthalmology;  Laterality: Right; 09/28/2019: CATARACT EXTRACTION W/PHACO; Left     Comment:  Procedure: CATARACT EXTRACTION PHACO AND INTRAOCULAR               LENS PLACEMENT (IOC) LEFT 9.31   00:57.5;  Surgeon:               Marchia Meiers, MD;  Location: [facility];                Service: Ophthalmology;  Laterality: Left; No date: CESAREAN SECTION 02/22/2015: COLONOSCOPY; N/A     Comment:  Procedure: COLONOSCOPY;  Surgeon: Hulen Luster, MD;                Location: [facility] ENDOSCOPY;  Service: Gastroenterology;                Laterality: N/A; No date: EYE SURGERY No date: KNEE ARTHROSCOPY AND ARTHROTOMY No date: WRIST SURGERY BMI    Body Mass Index: 34.90 kg/m     Reproductive/Obstetrics negative OB ROS                             Anesthesia Physical Anesthesia Plan  ASA: 2  Anesthesia Plan: General   Post-op Pain Management:    Induction:   PONV Risk Score and Plan:   Airway Management Planned:   Additional Equipment:   Intra-op Plan:   Post-operative Plan:   Informed Consent: I have reviewed the patients History and Physical, chart, labs and discussed the procedure including the risks, benefits and alternatives for the proposed anesthesia with the patient or  authorized representative who has indicated his/her understanding and acceptance.     Dental Advisory Given  Plan Discussed with: CRNA  Anesthesia Plan Comments:         Anesthesia Quick Evaluation

## 2020-10-03 NOTE — Transfer of Care (Signed)
Immediate Anesthesia Transfer of Care Note  Patient: IVA MONTELONGO  Procedure(s) Performed: COLONOSCOPY WITH PROPOFOL ESOPHAGOGASTRODUODENOSCOPY (EGD) WITH PROPOFOL  Patient Location: PACU  Anesthesia Type:General  Level of Consciousness: awake and sedated  Airway & Oxygen Therapy: Patient Spontanous Breathing and Patient connected to nasal cannula oxygen  Post-op Assessment: Report given to RN and Post -op Vital signs reviewed and stable  Post vital signs: Reviewed and stable  Last Vitals:  Vitals Value Taken Time  BP 93/55 10/03/20 0947  Temp 36 C 10/03/20 0947  Pulse 101 10/03/20 0948  Resp 20 10/03/20 0948  SpO2 97 % 10/03/20 0948  Vitals shown include unvalidated device data.  Last Pain:  Vitals:   10/03/20 0947  TempSrc: Tympanic  PainSc: 0-No pain         Complications: No notable events documented.

## 2020-10-03 NOTE — H&P (Signed)
Jonathon Bellows, MD [address] [place], [place], [place], [address] [id number], [place], [place], [place], [address] Phone: [phone]  Fax: [phone]  Primary Care Physician:  Rusty Aus, MD   Pre-Procedure History & Physical: HPI:  [name] is a 70 y.o. female is here for an endoscopy and colonoscopy    Past Medical History:  Diagnosis Date   Arthritis    Family history of adverse reaction to anesthesia    Sister - PONV   GERD (gastroesophageal reflux disease)    Hypertension    Migraine headache    had one 07/13/19 after approx 40 years without one   PONV (postoperative nausea and vomiting)    Wears dentures    Full upper    Past Surgical History:  Procedure Laterality Date   ABDOMINAL HYSTERECTOMY     CATARACT EXTRACTION W/PHACO Right 09/06/2019   Procedure: CATARACT EXTRACTION PHACO AND INTRAOCULAR LENS PLACEMENT ([place]) RIGHT 5.38  00:34.8;  Surgeon: Marchia Meiers, MD;  Location: [facility];  Service: Ophthalmology;  Laterality: Right;   CATARACT EXTRACTION W/PHACO Left 09/28/2019   Procedure: CATARACT EXTRACTION PHACO AND INTRAOCULAR LENS PLACEMENT (IOC) LEFT 9.31  00:57.5;  Surgeon: Marchia Meiers, MD;  Location: [facility];  Service: Ophthalmology;  Laterality: Left;   CESAREAN SECTION     COLONOSCOPY N/A 02/22/2015   Procedure: COLONOSCOPY;  Surgeon: Hulen Luster, MD;  Location: [facility] ENDOSCOPY;  Service: Gastroenterology;  Laterality: N/A;   EYE SURGERY     KNEE ARTHROSCOPY AND ARTHROTOMY     WRIST SURGERY      Prior to Admission medications   Medication Sig Start Date End Date Taking? Authorizing Provider  amitriptyline (ELAVIL) 25 MG tablet Take 1 tablet daily by mouth. 04/25/14  Yes [provider]  amLODipine (NORVASC) 5 MG tablet Take 1 tablet daily by mouth.   Yes [provider]  chlorthalidone (HYGROTON) 25 MG tablet Take 25 mg by mouth daily.   Yes [provider]  losartan (COZAAR) 100 MG  tablet Take 1 tablet daily by mouth. 01/21/17  Yes [provider]  pantoprazole (PROTONIX) 40 MG tablet Take 40 mg by mouth daily.   Yes [provider]  acetaminophen (TYLENOL) 650 MG CR tablet Take 650 mg by mouth every 8 (eight) hours as needed for pain.    [provider]  omeprazole (PRILOSEC) 20 MG capsule Take 20 mg by mouth daily. Patient not taking: Reported on 10/03/2020    [provider]  ondansetron (ZOFRAN ODT) 4 MG disintegrating tablet Take 1 tablet (4 mg total) every 8 (eight) hours as needed by mouth for nausea or vomiting. Patient not taking: No sig reported 02/19/17   Carrie Mew, MD    Allergies as of 09/06/2020 - Review Complete 09/05/2020  Allergen Reaction Noted   Aspirin Swelling 02/21/2015   Codeine Swelling 02/21/2015   Norco [hydrocodone-acetaminophen] Itching 02/21/2015   Percocet [oxycodone-acetaminophen] Hives 02/21/2015   Adhesive [tape] Rash 08/30/2019    Family History  Problem Relation Age of Onset   Breast cancer Neg Hx     Social History   Socioeconomic History   Marital status: Divorced    Spouse name: Not on file   Number of children: Not on file   Years of education: Not on file   Highest education level: Not on file  Occupational History   Not on file  Tobacco Use   Smoking status: Never   Smokeless tobacco: Never  Vaping Use   Vaping Use: Never used  Substance and Sexual Activity   Alcohol use: Yes    Alcohol/week: 1.0 standard drink    Types: 1 Standard drinks or equivalent per week    Comment: may have 1 drink per week   Drug use: Never   Sexual activity: Not on file  Other Topics Concern   Not on file  Social History Narrative   Not on file   Social Determinants of Health   Financial Resource Strain: Not on file  Food Insecurity: Not on file  Transportation Needs: Not on file  Physical Activity: Not on file  Stress: Not on file  Social Connections: Not on file  Intimate  Partner Violence: Not on file    Review of Systems: See HPI, otherwise negative ROS  Physical Exam: BP 140/68   Pulse 67   Temp (!) 96.4 F (35.8 C) (Temporal)   Resp 18   Ht 5\' 3"  (1.6 m)   Wt 89.4 kg   SpO2 98%   BMI 34.90 kg/m  General:   Alert,  pleasant and cooperative in NAD Head:  Normocephalic and atraumatic. Neck:  Supple; no masses or thyromegaly. Lungs:  Clear throughout to auscultation, normal respiratory effort.    Heart:  +S1, +S2, Regular rate and rhythm, No edema. Abdomen:  Soft, nontender and nondistended. Normal bowel sounds, without guarding, and without rebound.   Neurologic:  Alert and  oriented x4;  grossly normal neurologically.  Impression/Plan: [name] is here for an endoscopy and colonoscopy  to be performed for  evaluation of abdominal pain and colon cancer screening , father had colon cancer    Risks, benefits, limitations, and alternatives regarding endoscopy have been reviewed with the patient.  Questions have been answered.  All parties agreeable.   Jonathon Bellows, MD  10/03/2020, 9:06 AM

## 2020-10-03 NOTE — Op Note (Signed)
[facility] Gastroenterology Patient Name: [name] Procedure Date: 10/03/2020 9:16 AM MRN: [id number] Account #: [id number] Date of Birth: 1950/05/21 Admit Type: Outpatient Age: 70 Room: [facility] ENDO ROOM 3 Gender: Female Note Status: Finalized Procedure:             Colonoscopy Indications:           Colon cancer screening in patient at increased risk:                         Colorectal cancer in father Providers:             Jonathon Bellows MD, MD Medicines:             Monitored Anesthesia Care Complications:         No immediate complications. Procedure:             Pre-Anesthesia Assessment:                        - Prior to the procedure, a History and Physical was                         performed, and patient medications, allergies and                         sensitivities were reviewed. The patient's tolerance                         of previous anesthesia was reviewed.                        - The risks and benefits of the procedure and the                         sedation options and risks were discussed with the                         patient. All questions were answered and informed                         consent was obtained.                        - ASA Grade Assessment: II - A patient with mild                         systemic disease.                        After obtaining informed consent, the colonoscope was                         passed under direct vision. Throughout the procedure,                         the patient's blood pressure, pulse, and oxygen                         saturations were monitored continuously. The  Colonoscope was introduced through the anus and                         advanced to the the cecum, identified by the                         appendiceal orifice. The colonoscopy was performed                         with ease. The patient tolerated the procedure well.                         The  quality of the bowel preparation was good. Findings:      The perianal and digital rectal examinations were normal.      Multiple small-mouthed diverticula were found in the sigmoid colon.      The exam was otherwise without abnormality on direct and retroflexion       views. Impression:            - Diverticulosis in the sigmoid colon.                        - The examination was otherwise normal on direct and                         retroflexion views.                        - No specimens collected. Recommendation:        - Discharge patient to home (with escort).                        - Resume previous diet.                        - Continue present medications.                        - Repeat colonoscopy in 5 years for screening purposes. Procedure Code(s):     --- Professional ---                        [phone], Colonoscopy, flexible; diagnostic, including                         collection of specimen(s) by brushing or washing, when                         performed (separate procedure) Diagnosis Code(s):     --- Professional ---                        Z80.0, Family history of malignant neoplasm of                         digestive organs                        K57.30, Diverticulosis of large intestine without  perforation or abscess without bleeding CPT copyright 2019 American Medical Association. All rights reserved. The codes documented in this report are preliminary and upon coder review may  be revised to meet current compliance requirements. Jonathon Bellows, MD Jonathon Bellows MD, MD 10/03/2020 9:45:25 AM This report has been signed electronically. Number of Addenda: 0 Note Initiated On: 10/03/2020 9:16 AM Scope Withdrawal Time: 0 hours 10 minutes 58 seconds  Total Procedure Duration: 0 hours 15 minutes 59 seconds  Estimated Blood Loss:  Estimated blood loss: none.      [facility]

## 2020-10-03 NOTE — Op Note (Signed)
[facility] Gastroenterology Patient Name: [name] Procedure Date: 10/03/2020 9:16 AM MRN: [id number] Account #: [id number] Date of Birth: [birth year] Admit Type: Outpatient Age: 70 Room: [facility] ENDO ROOM 3 Gender: Female Note Status: Finalized Procedure:             Upper GI endoscopy Indications:           Abdominal pain Providers:             Jonathon Bellows MD, MD Referring MD:          Rusty Aus, MD (Referring MD) Medicines:             Monitored Anesthesia Care Complications:         No immediate complications. Procedure:             Pre-Anesthesia Assessment:                        - Prior to the procedure, a History and Physical was                         performed, and patient medications, allergies and                         sensitivities were reviewed. The patient's tolerance                         of previous anesthesia was reviewed.                        - The risks and benefits of the procedure and the                         sedation options and risks were discussed with the                         patient. All questions were answered and informed                         consent was obtained.                        - ASA Grade Assessment: II - A patient with mild                         systemic disease.                        After obtaining informed consent, the endoscope was                         passed under direct vision. Throughout the procedure,                         the patient's blood pressure, pulse, and oxygen                         saturations were monitored continuously. The Endoscope                         was introduced through the mouth, and  advanced to the                         third part of duodenum. The upper GI endoscopy was                         accomplished with ease. The patient tolerated the                         procedure well. Findings:      The examined duodenum was normal.      The esophagus was  normal.      The entire examined stomach was normal. Biopsies were taken with a cold       forceps for histology.      The cardia and gastric fundus were normal on retroflexion. Impression:            - Normal examined duodenum.                        - Normal esophagus.                        - Normal stomach. Biopsied. Recommendation:        - Await pathology results.                        - Perform a colonoscopy today. Procedure Code(s):     --- Professional ---                        [phone], Esophagogastroduodenoscopy, flexible,                         transoral; with biopsy, single or multiple Diagnosis Code(s):     --- Professional ---                        R10.9, Unspecified abdominal pain CPT copyright 2019 American Medical Association. All rights reserved. The codes documented in this report are preliminary and upon coder review may  be revised to meet current compliance requirements. Jonathon Bellows, MD Jonathon Bellows MD, MD 10/03/2020 9:26:51 AM This report has been signed electronically. Number of Addenda: 0 Note Initiated On: 10/03/2020 9:16 AM Estimated Blood Loss:  Estimated blood loss: none.      [facility]

## 2020-10-04 ENCOUNTER — Encounter: Payer: Self-pay | Admitting: Gastroenterology

## 2020-10-04 LAB — SURGICAL PATHOLOGY

## 2020-10-07 ENCOUNTER — Telehealth: Payer: Self-pay | Admitting: *Deleted

## 2020-10-07 ENCOUNTER — Encounter: Payer: Self-pay | Admitting: Gastroenterology

## 2020-10-07 DIAGNOSIS — R109 Unspecified abdominal pain: Secondary | ICD-10-CM

## 2020-10-07 NOTE — Telephone Encounter (Signed)
[name]- inform biopsies were normal. If improving watch and wait if no better then HIDA scan to be ordered

## 2020-10-07 NOTE — Telephone Encounter (Signed)
Patient called to inquire about biopsy results. Patient reports she is still nauseated in the morning. Patient reports she is watching what she eats.

## 2020-10-07 NOTE — Progress Notes (Signed)
ok 

## 2020-10-07 NOTE — Telephone Encounter (Signed)
Patient called back and was calling about results

## 2020-10-08 MED ORDER — OMEPRAZOLE 40 MG PO CPDR: 40.0000 mg | DELAYED_RELEASE_CAPSULE | Freq: Two times a day (BID) | ORAL | 3 refills | Status: DC

## 2020-10-08 MED ORDER — ONDANSETRON HCL 4 MG PO TABS: 4.0000 mg | ORAL_TABLET | Freq: Three times a day (TID) | ORAL | 1 refills | Status: DC | PRN

## 2020-10-08 MED ORDER — ONDANSETRON HCL 4 MG PO TABS: 4.0000 mg | ORAL_TABLET | Freq: Three times a day (TID) | ORAL | 0 refills | Status: DC | PRN

## 2020-10-08 NOTE — Telephone Encounter (Signed)
Called patient to let her know what Dr. Georgeann Oppenheim recommendations were and she agreed on take the medications and doing the HIDA Scan. This was scheduled and patient agreed on doing. Patient had no further questions.

## 2020-10-08 NOTE — Telephone Encounter (Signed)
Called patient back this morning ans she stated that she was not feeling well therefore she agreed on getting a HIDA Scan scheduled. Patient also stated that she continues to have abdominal pain, nausea and nausea. Patient wants to know if there is anything for her to do or take in the meantime while she gets her HIDA Scan scheduled.

## 2020-10-08 NOTE — Telephone Encounter (Signed)
Can prescribe some Zofran 4 mg PRN upti TID, ensure on PPI BID 40 mg prilosec, no nsaids,

## 2020-10-08 NOTE — Telephone Encounter (Signed)
Please advise 

## 2020-10-10 NOTE — Anesthesia Postprocedure Evaluation (Signed)
Anesthesia Post Note  Patient: [name]  Procedure(s) Performed: COLONOSCOPY WITH PROPOFOL ESOPHAGOGASTRODUODENOSCOPY (EGD) WITH PROPOFOL  Patient location during evaluation: PACU Anesthesia Type: General Level of consciousness: awake and alert Pain management: pain level controlled Vital Signs Assessment: post-procedure vital signs reviewed and stable Respiratory status: spontaneous breathing, nonlabored ventilation, respiratory function stable and patient connected to nasal cannula oxygen Cardiovascular status: blood pressure returned to baseline and stable Postop Assessment: no apparent nausea or vomiting Anesthetic complications: no   No notable events documented.   Last Vitals:  Vitals:   10/03/20 1007 10/03/20 1017  BP: (!) 142/66 (!) 138/58  Pulse: 65 (!) 57  Resp: 20 20  Temp:    SpO2: 95% 97%    Last Pain:  Vitals:   10/04/20 0743  TempSrc:   PainSc: 0-No pain                 Molli Barrows

## 2020-10-21 ENCOUNTER — Encounter
Admission: RE | Admit: 2020-10-21 | Discharge: 2020-10-21 | Disposition: A | Discharge status: Home / Self Care | Payer: Medicare HMO | Source: Ambulatory Visit | Attending: Gastroenterology | Admitting: Gastroenterology

## 2020-10-21 ENCOUNTER — Other Ambulatory Visit: Payer: Self-pay | Admitting: Gastroenterology

## 2020-10-21 ENCOUNTER — Other Ambulatory Visit: Payer: Self-pay

## 2020-10-21 DIAGNOSIS — R109 Unspecified abdominal pain: Secondary | ICD-10-CM | POA: Diagnosis not present

## 2020-10-21 MED ORDER — TECHNETIUM TC 99M MEBROFENIN IV KIT
4.8900 | PACK | Freq: Once | INTRAVENOUS | Status: AC | PRN
  Administered 2020-10-21: 4.89 via INTRAVENOUS

## 2020-10-21 MED ORDER — FLUDEOXYGLUCOSE F - 18 (FDG) INJECTION: 4.8900 | Freq: Once | INTRAVENOUS | Status: DC | PRN

## 2020-10-22 ENCOUNTER — Telehealth: Payer: Self-pay | Admitting: Gastroenterology

## 2020-10-22 NOTE — Telephone Encounter (Signed)
Patient LM requesting call when results are in to go over them with her.

## 2020-10-23 NOTE — Telephone Encounter (Signed)
Called patient back to give her lab results from her procedures. Patient stated that she continued to have abdominal pain but she would mention it to the doctor next Thursday when she comes in to see him. Patient then had no further questions.

## 2020-10-29 ENCOUNTER — Other Ambulatory Visit: Payer: Self-pay

## 2020-10-31 ENCOUNTER — Other Ambulatory Visit: Payer: Self-pay

## 2020-10-31 ENCOUNTER — Encounter: Payer: Self-pay | Admitting: Gastroenterology

## 2020-10-31 ENCOUNTER — Ambulatory Visit: Payer: Medicare HMO | Admitting: Gastroenterology

## 2020-10-31 VITALS — BP 148/71 | HR 65 | Temp 98.2°F | Ht 63.0 in | Wt 203.0 lb

## 2020-10-31 DIAGNOSIS — K219 Gastro-esophageal reflux disease without esophagitis: Secondary | ICD-10-CM | POA: Diagnosis not present

## 2020-10-31 NOTE — Progress Notes (Signed)
Stephanie Bellows MD, MRCP(U.K) [address]  [place]  [place], [place] [address]  Main: [phone]  Fax: [phone]   Primary Care Physician: Rusty Aus, MD  Primary Gastroenterologist:  Dr. Jonathon Church   Chief Complaint  Patient presents with   Abdominal Pain    HPI: [name] is a 70 y.o. female   Summary of history :  Initially referred and seen on 09/05/2020 for colon cancer screening and GERD.  At that point in time she had abdominal pain usually after meals lasting the whole day not relieved after bowel movement.  Was not on any NSAIDs.  Was on Protonix 20 mg first thing in the morning on an empty stomach.  That helped with the reflux.  No unintentional weight loss.  Interval history   09/05/2020-10/31/2020  10/03/2020: EGD: Normal.  Biopsies of stomach were taken.  No abnormalities noted.  Colonoscopy performed on the same day.  Diverticulosis in the sigmoid colon noted.  Otherwise complete examination was normal.  She is taking omeprazole 40 mg twice a day.  Doing much better with her acid reflux but still has breakthrough symptoms.  She is trying to lose weight.  Has not used a wedge pillow but has used pillow underneath her head.  Current Outpatient Medications  Medication Sig Dispense Refill   acetaminophen (TYLENOL) 650 MG CR tablet Take 650 mg by mouth every 8 (eight) hours as needed for pain.     amitriptyline (ELAVIL) 25 MG tablet Take 1 tablet daily by mouth.     amLODipine (NORVASC) 5 MG tablet Take 1 tablet daily by mouth.     chlorthalidone (HYGROTON) 25 MG tablet Take 25 mg by mouth daily.     losartan (COZAAR) 100 MG tablet Take 1 tablet daily by mouth.     omeprazole (PRILOSEC) 40 MG capsule Take 1 capsule (40 mg total) by mouth 2 (two) times daily. 180 capsule 3   ondansetron (ZOFRAN) 4 MG tablet Take 1 tablet (4 mg total) by mouth 3 (three) times daily as needed for nausea or vomiting. 30 tablet 1   No current facility-administered  medications for this visit.    Allergies as of 10/31/2020 - Review Complete 10/31/2020  Allergen Reaction Noted   Allevyn adhesive [wound dressings] Other (See Comments) 06/04/2008   Aspirin Swelling 02/21/2015   Codeine Swelling 02/21/2015   Norco [hydrocodone-acetaminophen] Itching 02/21/2015   Percocet [oxycodone-acetaminophen] Hives 02/21/2015   Adhesive [tape] Rash 08/30/2019   Sulfa antibiotics Rash 06/04/2008    ROS:  General: Negative for anorexia, weight loss, fever, chills, fatigue, weakness. ENT: Negative for hoarseness, difficulty swallowing , nasal congestion. CV: Negative for chest pain, angina, palpitations, dyspnea on exertion, peripheral edema.  Respiratory: Negative for dyspnea at rest, dyspnea on exertion, cough, sputum, wheezing.  GI: See history of present illness. GU:  Negative for dysuria, hematuria, urinary incontinence, urinary frequency, nocturnal urination.  Endo: Negative for unusual weight change.    Physical Examination:   BP (!) 148/71   Pulse 65   Temp 98.2 F (36.8 C) (Oral)   Ht 5\' 3"  (1.6 m)   Wt 203 lb (92.1 kg)   BMI 35.96 kg/m   General: Well-nourished, well-developed in no acute distress.  Eyes: No icterus. Conjunctivae pink. Mouth: Oropharyngeal mucosa moist and pink , no lesions erythema or exudate.. Neuro: Alert and oriented x 3.  Grossly intact. Skin: Warm and dry, no jaundice.   Psych: Alert and cooperative, normal mood and affect.  Imaging Studies: NM HEPATOBILIARY  Result Date: 10/22/2020 CLINICAL DATA:  Abdominal pain EXAM: NUCLEAR MEDICINE HEPATOBILIARY IMAGING TECHNIQUE: Sequential images of the abdomen were obtained out to 60 minutes following intravenous administration of radiopharmaceutical. RADIOPHARMACEUTICALS:  4.89 mCi Tc-61m  Choletec IV COMPARISON:  None. FINDINGS: Prompt uptake and biliary excretion of activity by the liver is seen. Status post cholecystectomy. Biliary activity passes into small bowel,  consistent with patent common bile duct. IMPRESSION: 1.  The common bile duct is patent. 2.  Status post cholecystectomy. Electronically Signed   By: Eddie Candle M.D.   On: 10/22/2020 10:10    Assessment and Plan:   [name] is a 77 y.o. y/o female here to follow-up for GERD which has responded well to 40 mg of omeprazole twice a day.  She still has some breakthrough symptoms.  I explained to her that she could try and use Pepcid 40 mg at night as needed.  I did tell her that eventually to solve the problem she probably would need to lose weight and lower her BMI which should help her significantly.  She has been following other lifestyle changes.  I advised her that once her weight comes down , can have Rusty Aus, MD to gradually decrease the dose of PPI.  I do not think that she can come off PPI completely unless she loses a significantly good quantity of weight as the abdominal pressure on her stomach contents will be driving the acid into her esophagus when she lays flat at night.  I strongly suggested her to use a wedge pillow at night.  I have given her a picture of the wedge pillow.    Dr Stephanie Bellows  MD,MRCP [facility]) Follow up in as needed

## 2020-11-01 ENCOUNTER — Other Ambulatory Visit: Payer: Self-pay

## 2020-11-01 MED ORDER — OMEPRAZOLE 40 MG PO CPDR: 40.0000 mg | DELAYED_RELEASE_CAPSULE | Freq: Two times a day (BID) | ORAL | 3 refills | Status: DC

## 2021-05-19 DIAGNOSIS — E119 Type 2 diabetes mellitus without complications: Secondary | ICD-10-CM | POA: Insufficient documentation

## 2021-05-19 DIAGNOSIS — F330 Major depressive disorder, recurrent, mild: Secondary | ICD-10-CM | POA: Insufficient documentation

## 2021-07-21 DIAGNOSIS — M7541 Impingement syndrome of right shoulder: Secondary | ICD-10-CM | POA: Diagnosis not present

## 2021-07-21 DIAGNOSIS — M25511 Pain in right shoulder: Secondary | ICD-10-CM | POA: Diagnosis not present

## 2021-08-14 ENCOUNTER — Emergency Department: Payer: Medicare PPO

## 2021-08-14 ENCOUNTER — Emergency Department
Admission: EM | Admit: 2021-08-14 | Discharge: 2021-08-15 | Disposition: A | Discharge status: Home / Self Care | Payer: Medicare PPO | Attending: Emergency Medicine | Admitting: Emergency Medicine

## 2021-08-14 ENCOUNTER — Other Ambulatory Visit: Payer: Self-pay

## 2021-08-14 DIAGNOSIS — I10 Essential (primary) hypertension: Secondary | ICD-10-CM | POA: Insufficient documentation

## 2021-08-14 DIAGNOSIS — Z96652 Presence of left artificial knee joint: Secondary | ICD-10-CM | POA: Insufficient documentation

## 2021-08-14 DIAGNOSIS — K5900 Constipation, unspecified: Secondary | ICD-10-CM | POA: Diagnosis not present

## 2021-08-14 DIAGNOSIS — R1032 Left lower quadrant pain: Secondary | ICD-10-CM | POA: Diagnosis present

## 2021-08-14 DIAGNOSIS — R630 Anorexia: Secondary | ICD-10-CM | POA: Diagnosis not present

## 2021-08-14 DIAGNOSIS — R944 Abnormal results of kidney function studies: Secondary | ICD-10-CM | POA: Diagnosis not present

## 2021-08-14 DIAGNOSIS — K5792 Diverticulitis of intestine, part unspecified, without perforation or abscess without bleeding: Secondary | ICD-10-CM

## 2021-08-14 DIAGNOSIS — K5732 Diverticulitis of large intestine without perforation or abscess without bleeding: Secondary | ICD-10-CM | POA: Diagnosis not present

## 2021-08-14 LAB — COMPREHENSIVE METABOLIC PANEL
ALT: 17 U/L (ref 0–44)
AST: 17 U/L (ref 15–41)
Albumin: 4 g/dL (ref 3.5–5.0)
Alkaline Phosphatase: 101 U/L (ref 38–126)
Anion gap: 9 (ref 5–15)
BUN: 32 mg/dL — ABNORMAL HIGH (ref 8–23)
CO2: 26 mmol/L (ref 22–32)
Calcium: 9.3 mg/dL (ref 8.9–10.3)
Chloride: 102 mmol/L (ref 98–111)
Creatinine, Ser: 1.32 mg/dL — ABNORMAL HIGH (ref 0.44–1.00)
GFR, Estimated: 43 mL/min — ABNORMAL LOW (ref >60)
Glucose, Bld: 97 mg/dL (ref 70–99)
Potassium: 4.3 mmol/L (ref 3.5–5.1)
Sodium: 137 mmol/L (ref 135–145)
Total Bilirubin: 0.6 mg/dL (ref 0.3–1.2)
Total Protein: 7.8 g/dL (ref 6.5–8.1)

## 2021-08-14 LAB — CBC
HCT: 40.6 % (ref 36.0–46.0)
Hemoglobin: 12.9 g/dL (ref 12.0–15.0)
MCH: 29.1 pg (ref 26.0–34.0)
MCHC: 31.8 g/dL (ref 30.0–36.0)
MCV: 91.6 fL (ref 80.0–100.0)
Platelets: 241 10*3/uL (ref 150–400)
RBC: 4.43 MIL/uL (ref 3.87–5.11)
RDW: 12.9 % (ref 11.5–15.5)
WBC: 10.1 10*3/uL (ref 4.0–10.5)
nRBC: 0 % (ref 0.0–0.2)

## 2021-08-14 LAB — LIPASE, BLOOD: Lipase: 38 U/L (ref 11–51)

## 2021-08-14 MED ORDER — IOHEXOL 300 MG/ML  SOLN
80.0000 mL | Freq: Once | INTRAMUSCULAR | Status: AC | PRN
  Administered 2021-08-14: 80 mL via INTRAVENOUS

## 2021-08-14 MED ORDER — ONDANSETRON HCL 4 MG/2ML IJ SOLN
4.0000 mg | Freq: Once | INTRAMUSCULAR | Status: AC
  Administered 2021-08-14: 4 mg via INTRAVENOUS
  Filled 2021-08-14: qty 2

## 2021-08-14 MED ORDER — HYDROMORPHONE HCL 1 MG/ML IJ SOLN
0.5000 mg | Freq: Once | INTRAMUSCULAR | Status: AC
  Administered 2021-08-14: 0.5 mg via INTRAVENOUS
  Filled 2021-08-14: qty 0.5

## 2021-08-14 MED ORDER — SODIUM CHLORIDE 0.9 % IV BOLUS
1000.0000 mL | Freq: Once | INTRAVENOUS | Status: AC
  Administered 2021-08-14: 1000 mL via INTRAVENOUS

## 2021-08-14 NOTE — ED Provider Notes (Signed)
? ?[facility] ?Provider Note ? ? ? Event Date/Time  ? First MD Initiated Contact with Patient 08/14/21 2333   ?  (approximate) ? ? ?History  ? ?Abdominal Pain ? ? ?HPI ? ?[name] is a 71 y.o. female with past medical history of hypertension, GERD, migraines who presents with abdominal pain.  Patient notes that she has had pain for about 4 weeks.  Pain is located in the left lower quadrant intermittent.  Has been worsening over the last she initially felt constipated earlier in the week and then had some loose stools recently.  Denies fevers chills.  Has had nausea but no vomiting.  No urinary symptoms.  Patient is not sure if she has had diverticulitis in the past.  Has had prior C-section hysterectomy and cholecystectomy.  Has had decreased p.o. intake due to decreased appetite.  Saw her physician today who referred her to the ED for evaluation. ?  ? ?Past Medical History:  ?Diagnosis Date  ? Arthritis   ? Family history of adverse reaction to anesthesia   ? Sister - PONV  ? GERD (gastroesophageal reflux disease)   ? Hypertension   ? Migraine headache   ? had one 07/13/19 after approx 40 years without one  ? PONV (postoperative nausea and vomiting)   ? Wears dentures   ? Full upper  ? ? ?Patient Active Problem List  ? Diagnosis Date Noted  ? Benign essential hypertension 08/09/2020  ? 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 Medicare annual wellness visit, initial 08/09/2020  ? Hyperthyroidism, subclinical 11/17/2019  ? Abnormal finding on thyroid function test 03/29/2019  ? High alkaline phosphatase 03/29/2019  ? Hallux rigidus, right foot 03/24/2019  ? Back pain with radiation 01/20/2019  ? Hip pain, right 01/20/2019  ? Hyperlipidemia 02/28/2018  ? Osteopenia 02/18/2018  ? Ovarian failure 08/13/2016  ? S/P surgical manipulation of knee joint 12/20/2013  ? Primary localized osteoarthrosis, lower leg 09/12/2013  ? Vitamin D deficiency 08/26/2012  ? Prediabetes 08/25/2012  ?  Obesity 11/27/2008  ? Gastroesophageal reflux disease 05/22/2008  ? Generalized anxiety disorder 05/22/2008  ? Hypertension 04/05/2007  ? ? ? ?Physical Exam  ?Triage Vital Signs: ?ED Triage Vitals  ?Enc Vitals Group  ?   BP 08/14/21 1713 136/71  ?   Pulse Rate 08/14/21 1713 61  ?   Resp 08/14/21 1713 18  ?   Temp 08/14/21 1711 98 ?F (36.7 ?C)  ?   Temp Source 08/14/21 1711 Oral  ?   SpO2 08/14/21 1713 96 %  ?   Weight 08/14/21 2336 203 lb (92.1 kg)  ?   Height 08/14/21 2336 '5\' 3"'$  (1.6 m)  ?   Head Circumference --   ?   Peak Flow --   ?   Pain Score 08/14/21 1712 10  ?   Pain Loc --   ?   Pain Edu? --   ?   Excl. in [place]? --   ? ? ?Most recent vital signs: ?Vitals:  ? 08/15/21 0000 08/15/21 0100  ?BP: 133/63 (!) 134/59  ?Pulse: 64 (!) 58  ?Resp:  16  ?Temp:    ?SpO2: 98% 93%  ? ? ? ?General: Awake, patient appears somewhat uncomfortable but is nontoxic ?CV:  Good peripheral perfusion.  ?Resp:  Normal effort.  ?Abd:  No distention.  Abdomen is focally tender in the left lower quadrant with voluntary guarding ?Neuro:  Awake, Alert, Oriented x 3  ?Other:   ? ? ?ED Results / Procedures / Treatments  ?Labs ?(all labs ordered are listed, but only abnormal results are displayed) ?Labs Reviewed  ?COMPREHENSIVE METABOLIC PANEL - Abnormal; Notable for the following components:  ?    Result Value  ? BUN 32 (*)   ? Creatinine, Ser 1.32 (*)   ? GFR, Estimated 43 (*)   ? All other components within normal limits  ?LIPASE, BLOOD  ?CBC  ?URINALYSIS, ROUTINE W REFLEX MICROSCOPIC  ? ? ? ?EKG ? ?EKG interpreted by myself shows normal sinus rhythm with right bundle branch block no acute ischemic changes ? ? ?RADIOLOGY ?I reviewed the CT of the abdomen pelvis which is consistent with acute uncomplicated diverticulitis ? ? ?PROCEDURES: ? ?Critical Care performed: No ? ?Procedures ? ?MEDICATIONS ORDERED IN ED: ?Medications  ?amoxicillin-clavulanate (AUGMENTIN) 875-125 MG per tablet 1 tablet (has no administration in time range)   ?iohexol (OMNIPAQUE) 300 MG/ML solution 80 mL (80 mLs Intravenous Contrast Given 08/14/21 1805)  ?sodium chloride 0.9 % bolus 1,000 mL (1,000 mLs Intravenous New Bag/Given 08/14/21 2359)  ?ondansetron [facility]) injection 4 mg (4 mg Intravenous Given 08/14/21 2359)  ?HYDROmorphone (DILAUDID) injection 0.5 mg (0.5 mg Intravenous Given 08/14/21 2359)  ?HYDROmorphone (DILAUDID) injection 0.5 mg (0.5 mg Intravenous Given 08/15/21 0106)  ?metoCLOPramide (REGLAN) injection 10 mg (10 mg Intravenous Given 08/15/21 0106)  ? ? ? ?IMPRESSION / MDM / ASSESSMENT AND PLAN / ED COURSE  ?I reviewed the triage vital signs and the nursing notes. ?             ?               ? ?Differential diagnosis includes, but is not limited to, uncomplicated diverticulitis, diverticulitis with abscess or perforation, colitis, constipation, bowel obstruction ? ?Patient is a 71 year old female who presents with left lower quadrant pain x4 weeks but worsening over the last week.  Is associated with nausea anorexia and constipation now diarrhea.  Vital signs within normal limits.  Patient is nontoxic-appearing but does appear somewhat uncomfortable.  She is focally tender in the left lower quadrant.  Exam suspicious for diverticulitis.  Labs from triage are all reassuring she has no leukocytosis, just mild AKI creatinine 1.32 from baseline of around 1.  Lipase and LFTs are within normal limits.  CT abdomen pelvis obtained which shows uncomplicated diverticulitis.  This is consistent with patient's exam.  We will give her a liter of fluids IV Dilaudid and Zofran.  If pain is adequately controlled and patient tolerating p.o. she can likely be discharged with p.o. antibiotics and outpatient follow-up with GI. ? ?After initial dose of IV Dilaudid patient still uncomfortable also given another 0.5 mg.  On reassessment she is feeling improved think she will be okay for discharge.  Recommended standing Tylenol and Motrin and will prescribe oxycodone as needed for  breakthrough pain MiraLAX prevent constipation and Zofran for nausea.  7 days of Augmentin prescribed.  We discussed return precautions. ? ? ?FINAL CLINICAL IMPRESSION(S) / ED DIAGNOSES  ? ?Final diagnoses:  ?Diverticulitis  ? ? ? ?Rx / DC Orders  ? ?ED Discharge Orders   ? ?      Ordered  ?  oxyCODONE (ROXICODONE) 5 MG immediate release tablet  Every 8 hours PRN       ? 08/15/21 0202  ?  ondansetron (ZOFRAN) 4 MG tablet  Daily PRN       ? 08/15/21 0202  ?  polyethylene glycol (MIRALAX) 17 g packet  Daily       ? 08/15/21 0202  ?  amoxicillin-clavulanate (AUGMENTIN) 875-125 MG tablet  2 times daily       ? 08/15/21 0202  ? ?  ?  ? ?  ? ? ? ?Note:  This document was prepared using Dragon voice recognition software and may include unintentional dictation errors. ?  ?Rada Hay, MD ?08/15/21 1410 ? ?

## 2021-08-14 NOTE — ED Triage Notes (Signed)
Patient to ER via POV with complaints of LLQ pain x3 weeks. Reports history of diverticulitis and intestinal blockage. Denies NVD. No constipation. Felt like she was going to pass out today when she went to her doctor's appointment, reports becoming "swimmy headed and nauseous".  ?

## 2021-08-14 NOTE — ED Provider Triage Note (Signed)
?  Emergency Medicine Provider Triage Evaluation Note ? ?[name] , a 71 y.o.female,  was evaluated in triage.  Pt complains of left lower quadrant abdominal pain has been going on for the past 3 weeks.  Reports a history of diverticulitis and intestinal blockages.  When she went to her regular doctor's appointment today, she felt swimmy headed and nauseous. ? ? ?Review of Systems  ?Positive: Abdominal pain, nausea. ?Negative: Denies fever, chest pain, vomiting ? ?Physical Exam  ? ?Vitals:  ? 08/14/21 1711 08/14/21 1713  ?BP:  136/71  ?Pulse:  61  ?Resp:  18  ?Temp: 98 ?F (36.7 ?C)   ?SpO2:  96%  ? ?Gen:   Awake, no distress   ?Resp:  Normal effort  ?MSK:   Moves extremities without difficulty  ?Other:  Tenderness when palpating the left lower quadrant. ? ?Medical Decision Making  ?Given the patient's initial medical screening exam, the following diagnostic evaluation has been ordered. The patient will be placed in the appropriate treatment space, once one is available, to complete the evaluation and treatment. I have discussed the plan of care with the patient and I have advised the patient that an ED physician or mid-level practitioner will reevaluate their condition after the test results have been received, as the results may give them additional insight into the type of treatment they may need.  ? ? ?Diagnostics: Labs, CT abdomen/pelvis. ? ?Treatments: none immediately ?  ?Teodoro Spray, [place] ?08/14/21 1727 ? ?

## 2021-08-15 MED ORDER — ONDANSETRON HCL 4 MG PO TABS: 4.0000 mg | ORAL_TABLET | Freq: Every day | ORAL | 0 refills | Status: AC | PRN

## 2021-08-15 MED ORDER — AMOXICILLIN-POT CLAVULANATE 875-125 MG PO TABS
1.0000 | ORAL_TABLET | Freq: Once | ORAL | Status: AC
  Administered 2021-08-15: 1 via ORAL
  Filled 2021-08-15: qty 1

## 2021-08-15 MED ORDER — POLYETHYLENE GLYCOL 3350 17 G PO PACK: 17.0000 g | PACK | Freq: Every day | ORAL | 0 refills | Status: DC

## 2021-08-15 MED ORDER — OXYCODONE HCL 5 MG PO TABS: 5.0000 mg | ORAL_TABLET | Freq: Three times a day (TID) | ORAL | 0 refills | Status: AC | PRN

## 2021-08-15 MED ORDER — HYDROMORPHONE HCL 1 MG/ML IJ SOLN
0.5000 mg | Freq: Once | INTRAMUSCULAR | Status: AC
  Administered 2021-08-15: 0.5 mg via INTRAVENOUS
  Filled 2021-08-15: qty 0.5

## 2021-08-15 MED ORDER — AMOXICILLIN-POT CLAVULANATE 875-125 MG PO TABS: 1.0000 | ORAL_TABLET | Freq: Two times a day (BID) | ORAL | 0 refills | Status: AC

## 2021-08-15 MED ORDER — METOCLOPRAMIDE HCL 5 MG/ML IJ SOLN
10.0000 mg | Freq: Once | INTRAMUSCULAR | Status: AC
Start: 2021-08-15 — End: 2021-08-15
  Administered 2021-08-15: 10 mg via INTRAVENOUS
  Filled 2021-08-15: qty 2

## 2021-08-15 MED ORDER — ONDANSETRON 4 MG PO TBDP: 4.0000 mg | ORAL_TABLET | Freq: Once | ORAL | Status: DC

## 2021-08-15 MED ORDER — ONDANSETRON 4 MG PO TBDP
4.0000 mg | ORAL_TABLET | Freq: Once | ORAL | Status: AC
  Administered 2021-08-15: 4 mg via ORAL
  Filled 2021-08-15: qty 1

## 2021-08-15 NOTE — Discharge Instructions (Addendum)
You have diverticulitis which is inflammation of the left side of your colon.  You should take ibuprofen and Tylenol for pain and you can take the oxycodone as needed for breakthrough pain.  The oxycodone will tend to make you constipated so if you are needing it please also take the MiraLAX.  You can take the Zofran for nausea.  Please also take the antibiotic twice a day for the next 7 days to treat the diverticulitis.  If your pain is not controlled at home and is worsening you develop fevers or unable to drink please return to the emergency department. ?

## 2021-08-20 DIAGNOSIS — D3501 Benign neoplasm of right adrenal gland: Secondary | ICD-10-CM | POA: Insufficient documentation

## 2021-08-25 ENCOUNTER — Ambulatory Visit
Admission: RE | Admit: 2021-08-25 | Discharge: 2021-08-25 | Disposition: A | Discharge status: Home / Self Care | Payer: Medicare PPO | Source: Ambulatory Visit | Attending: Internal Medicine | Admitting: Internal Medicine

## 2021-08-25 ENCOUNTER — Other Ambulatory Visit: Payer: Self-pay | Admitting: Internal Medicine

## 2021-08-25 DIAGNOSIS — K5792 Diverticulitis of intestine, part unspecified, without perforation or abscess without bleeding: Secondary | ICD-10-CM

## 2021-09-09 ENCOUNTER — Other Ambulatory Visit: Payer: Self-pay

## 2021-09-09 ENCOUNTER — Emergency Department: Payer: Medicare PPO

## 2021-09-09 ENCOUNTER — Emergency Department
Admission: EM | Admit: 2021-09-09 | Discharge: 2021-09-09 | Disposition: A | Discharge status: Home / Self Care | Payer: Medicare PPO | Attending: Emergency Medicine | Admitting: Emergency Medicine

## 2021-09-09 DIAGNOSIS — R110 Nausea: Secondary | ICD-10-CM | POA: Insufficient documentation

## 2021-09-09 DIAGNOSIS — R1032 Left lower quadrant pain: Secondary | ICD-10-CM | POA: Insufficient documentation

## 2021-09-09 DIAGNOSIS — R109 Unspecified abdominal pain: Secondary | ICD-10-CM | POA: Diagnosis present

## 2021-09-09 DIAGNOSIS — R1030 Lower abdominal pain, unspecified: Secondary | ICD-10-CM

## 2021-09-09 LAB — COMPREHENSIVE METABOLIC PANEL
ALT: 15 U/L (ref 0–44)
AST: 18 U/L (ref 15–41)
Albumin: 4.3 g/dL (ref 3.5–5.0)
Alkaline Phosphatase: 80 U/L (ref 38–126)
Anion gap: 9 (ref 5–15)
BUN: 17 mg/dL (ref 8–23)
CO2: 28 mmol/L (ref 22–32)
Calcium: 9.6 mg/dL (ref 8.9–10.3)
Chloride: 100 mmol/L (ref 98–111)
Creatinine, Ser: 0.95 mg/dL (ref 0.44–1.00)
GFR, Estimated: >60 mL/min (ref >60)
Glucose, Bld: 115 mg/dL — ABNORMAL HIGH (ref 70–99)
Potassium: 4.6 mmol/L (ref 3.5–5.1)
Sodium: 137 mmol/L (ref 135–145)
Total Bilirubin: 0.7 mg/dL (ref 0.3–1.2)
Total Protein: 7.5 g/dL (ref 6.5–8.1)

## 2021-09-09 LAB — CBC
HCT: 41.7 % (ref 36.0–46.0)
Hemoglobin: 13.1 g/dL (ref 12.0–15.0)
MCH: 28.8 pg (ref 26.0–34.0)
MCHC: 31.4 g/dL (ref 30.0–36.0)
MCV: 91.6 fL (ref 80.0–100.0)
Platelets: 239 10*3/uL (ref 150–400)
RBC: 4.55 MIL/uL (ref 3.87–5.11)
RDW: 12.9 % (ref 11.5–15.5)
WBC: 8.9 10*3/uL (ref 4.0–10.5)
nRBC: 0 % (ref 0.0–0.2)

## 2021-09-09 LAB — URINALYSIS, ROUTINE W REFLEX MICROSCOPIC
Bilirubin Urine: NEGATIVE
Glucose, UA: NEGATIVE mg/dL
Hgb urine dipstick: NEGATIVE
Ketones, ur: NEGATIVE mg/dL
Leukocytes,Ua: NEGATIVE
Nitrite: NEGATIVE
Protein, ur: 30 mg/dL — AB
Specific Gravity, Urine: 1.01 (ref 1.005–1.030)
pH: 8 (ref 5.0–8.0)

## 2021-09-09 LAB — LIPASE, BLOOD: Lipase: 30 U/L (ref 11–51)

## 2021-09-09 MED ORDER — SODIUM CHLORIDE 0.9 % IV BOLUS
500.0000 mL | Freq: Once | INTRAVENOUS | Status: AC
  Administered 2021-09-09: 500 mL via INTRAVENOUS

## 2021-09-09 MED ORDER — IOHEXOL 300 MG/ML  SOLN
100.0000 mL | Freq: Once | INTRAMUSCULAR | Status: AC | PRN
  Administered 2021-09-09: 100 mL via INTRAVENOUS

## 2021-09-09 MED ORDER — ONDANSETRON HCL 4 MG/2ML IJ SOLN
4.0000 mg | Freq: Once | INTRAMUSCULAR | Status: AC
  Administered 2021-09-09: 4 mg via INTRAVENOUS
  Filled 2021-09-09: qty 2

## 2021-09-09 MED ORDER — DICYCLOMINE HCL 10 MG PO CAPS
10.0000 mg | ORAL_CAPSULE | Freq: Once | ORAL | Status: AC
  Administered 2021-09-09: 10 mg via ORAL
  Filled 2021-09-09: qty 1

## 2021-09-09 MED ORDER — FAMOTIDINE IN NACL 20-0.9 MG/50ML-% IV SOLN
20.0000 mg | Freq: Once | INTRAVENOUS | Status: AC
  Administered 2021-09-09: 20 mg via INTRAVENOUS
  Filled 2021-09-09: qty 50

## 2021-09-09 MED ORDER — ONDANSETRON 4 MG PO TBDP: 4.0000 mg | ORAL_TABLET | Freq: Three times a day (TID) | ORAL | 0 refills | Status: DC | PRN

## 2021-09-09 MED ORDER — FAMOTIDINE 20 MG PO TABS: 20.0000 mg | ORAL_TABLET | Freq: Two times a day (BID) | ORAL | 1 refills | Status: DC

## 2021-09-09 MED ORDER — FENTANYL CITRATE PF 50 MCG/ML IJ SOSY
50.0000 ug | PREFILLED_SYRINGE | Freq: Once | INTRAMUSCULAR | Status: AC
  Administered 2021-09-09: 50 ug via INTRAVENOUS
  Filled 2021-09-09: qty 1

## 2021-09-09 MED ORDER — DICYCLOMINE HCL 10 MG PO CAPS: 10.0000 mg | ORAL_CAPSULE | Freq: Three times a day (TID) | ORAL | 0 refills | Status: DC

## 2021-09-09 NOTE — ED Provider Notes (Signed)
[facility] Provider Note    Event Date/Time   First MD Initiated Contact with Patient 09/09/21 1059     (approximate)   History   Abdominal Pain   HPI  [name] is a 71 y.o. female presents emergency department with continued abdominal pain.  Patient was diagnosed with diverticulitis 3 weeks ago.  Was placed on oral antibiotics.  She then followed up with her physician who did a another CT which showed some resolution but still some inflammation and started her on a different antibiotic.  Patient continues to have burning and pain.  States it feels like the bowel is twisting.  She has had some diarrhea which may be attributed to the antibiotic.  Some nausea but no vomiting.      Physical Exam   Triage Vital Signs: ED Triage Vitals  Enc Vitals Group     BP 09/09/21 0941 (!) 160/65     Pulse Rate 09/09/21 0940 70     Resp 09/09/21 0940 16     Temp 09/09/21 0940 97.6 F (36.4 C)     Temp Source 09/09/21 0940 Oral     SpO2 09/09/21 0940 96 %     Weight 09/09/21 0941 200 lb (90.7 kg)     Height 09/09/21 0941 '5\' 3"'$  (1.6 m)     Head Circumference --      Peak Flow --      Pain Score 09/09/21 0941 8     Pain Loc --      Pain Edu? --      Excl. in [place]? --     Most recent vital signs: Vitals:   09/09/21 1300 09/09/21 1430  BP: 94/71 101/77  Pulse: (!) 59 64  Resp: 18 16  Temp:  97.8 F (36.6 C)  SpO2: 97% 97%     General: Awake, no distress.   CV:  Good peripheral perfusion. regular rate and  rhythm Resp:  Normal effort. Abd:  No distention.  Abdomen is tender in the left flank, left lower quadrant is still tender to palpation, bowel sounds normal Other:      ED Results / Procedures / Treatments   Labs (all labs ordered are listed, but only abnormal results are displayed) Labs Reviewed  COMPREHENSIVE METABOLIC PANEL - Abnormal; Notable for the following components:      Result Value   Glucose, Bld 115 (*)    All other  components within normal limits  URINALYSIS, ROUTINE W REFLEX MICROSCOPIC - Abnormal; Notable for the following components:   Color, Urine YELLOW (*)    APPearance CLEAR (*)    Protein, ur 30 (*)    Bacteria, UA RARE (*)    All other components within normal limits  LIPASE, BLOOD  CBC     EKG     RADIOLOGY CT abdomen/pelvis IV contrast    PROCEDURES:   Procedures   MEDICATIONS ORDERED IN ED: Medications  ondansetron (ZOFRAN) injection 4 mg (4 mg Intravenous Given 09/09/21 1130)  fentaNYL (SUBLIMAZE) injection 50 mcg (50 mcg Intravenous Given 09/09/21 1126)  sodium chloride 0.9 % bolus 500 mL (0 mLs Intravenous Stopped 09/09/21 1309)  iohexol (OMNIPAQUE) 300 MG/ML solution 100 mL (100 mLs Intravenous Contrast Given 09/09/21 1149)  dicyclomine (BENTYL) capsule 10 mg (10 mg Oral Given 09/09/21 1230)  famotidine (PEPCID) IVPB 20 mg premix (0 mg Intravenous Stopped 09/09/21 1357)     IMPRESSION / MDM / ASSESSMENT AND PLAN / ED COURSE  I  reviewed the triage vital signs and the nursing notes.                              Differential diagnosis includes, but is not limited to, chronic diverticulitis, acute diverticulitis, abscess, perforation, bowel obstruction, sepsis  Patient's presentation is most consistent with acute complicated illness / injury requiring diagnostic workup.   Labs and imaging ordered.  Patient's labs are reassuring, CBC, metabolic panel, lipase and urinalysis are all normal  Therefore feel that sepsis is less likely, bowel perforation is less likely.  CT was interpreted by me as being negative for any acute abnormality.  Her diverticulitis appears to have resolved.  I did explain these findings to the patient.  She is still complaining of abdominal pain and nausea.  She was given medication for nausea and pain medication.  She was also given Pepcid IV, Bentyl p.o.  Patient had some relief with the symptoms.  Due to the ongoing pain I did discuss this  with Dr. Vicente Males, as a consult to GI Dr. Vicente Males advises having her stop the antibiotics that they may be causing some abdominal discomfort.  She may be discharged home if she is able to tolerate liquids and foods.  Recommends follow-up outpatient if not improving to 3 days.  Patient did tolerate p.o. challenge with saltines and liquids.  He was given prescription for Pepcid, Bentyl, and Zofran.  She is to follow-up with her regular doctor Dr. Vicente Males.  Patient is in agreement treatment plan.  She was discharged stable condition.  I did consider admission but I do not have any pertinent findings to admit the patient and says she is able to drink and eat without difficulty she can follow-up outpatient        FINAL CLINICAL IMPRESSION(S) / ED DIAGNOSES   Final diagnoses:  Lower abdominal pain     Rx / DC Orders   ED Discharge Orders          Ordered    famotidine (PEPCID) 20 MG tablet  2 times daily        09/09/21 1408    dicyclomine (BENTYL) 10 MG capsule  3 times daily before meals        09/09/21 1408    ondansetron (ZOFRAN-ODT) 4 MG disintegrating tablet  Every 8 hours PRN        09/09/21 1408             Note:  This document was prepared using Dragon voice recognition software and may include unintentional dictation errors.    Versie Starks, PA-C 09/09/21 1607    Harvest Dark, MD 09/11/21 [phone]

## 2021-09-09 NOTE — Discharge Instructions (Signed)
Follow-up with your regular doctor as needed.  Follow-up with GI concerning the continued abdominal pain.  Your CT does not show any inflammation or diverticulitis.  There is no bowel perforation or bowel twisting.  Some of your symptoms may be coming from the antibiotics.  Take a probiotic that is over-the-counter such as align or the generic form of this medication.  We could also eat yogurt which would require 2 servings of the activity a yogurt. The Bentyl we gave you in the emergency department is to calm the abdominal spasms.

## 2021-09-09 NOTE — ED Triage Notes (Signed)
Pt here with abd pain and N/V that started this morning. Pt states pain is left and lowered and does not radiate. Pt also states diarrhea as well.

## 2021-09-09 NOTE — ED Notes (Signed)
Provider at bedside. Pt diagnosed with diverticulitis 3 weeks ago, taking prescribed abx, and still feeling bad. States woke up at 0100 today with pain. Has had alternating episodes of diarrhea and constipation. Pt crying in frustration. Complains of LLQ abdominal pain, persistent.

## 2021-09-09 NOTE — ED Notes (Addendum)
Pt refuses to try any crackers or ginger ale but agrees to try in about 15 min. Last time vomiting was 830am.

## 2021-09-09 NOTE — ED Notes (Signed)
Iv medication complete. Pt resiting quietly with lights turned off for comfort. Crackers and ginger ale provided for po challenge.

## 2021-09-10 ENCOUNTER — Other Ambulatory Visit: Payer: Self-pay

## 2021-09-10 ENCOUNTER — Encounter: Payer: Self-pay | Admitting: Gastroenterology

## 2021-09-10 ENCOUNTER — Ambulatory Visit: Payer: Medicare PPO | Admitting: Gastroenterology

## 2021-09-10 VITALS — BP 157/80 | HR 65 | Temp 98.9°F | Wt 206.0 lb

## 2021-09-10 DIAGNOSIS — K219 Gastro-esophageal reflux disease without esophagitis: Secondary | ICD-10-CM | POA: Diagnosis not present

## 2021-09-10 DIAGNOSIS — K5900 Constipation, unspecified: Secondary | ICD-10-CM | POA: Diagnosis not present

## 2021-09-10 DIAGNOSIS — R109 Unspecified abdominal pain: Secondary | ICD-10-CM | POA: Diagnosis not present

## 2021-09-10 MED ORDER — DICYCLOMINE HCL 10 MG PO CAPS: 10.0000 mg | ORAL_CAPSULE | Freq: Three times a day (TID) | ORAL | 2 refills | Status: DC

## 2021-09-10 MED ORDER — OMEPRAZOLE 40 MG PO CPDR: 40.0000 mg | DELAYED_RELEASE_CAPSULE | Freq: Two times a day (BID) | ORAL | 3 refills | Status: DC

## 2021-09-10 NOTE — Progress Notes (Signed)
Jonathon Bellows MD, MRCP(U.K) [address]  [place], [place] [address]  Main: [phone]  Fax: [phone]   Primary Care Physician: Rusty Aus, MD  Primary Gastroenterologist:  Dr. Jonathon Bellows   Chief Complaint  Patient presents with   Abdominal Pain    HPI: [name] is a 71 y.o. female   Summary of history :   Last seen at my office in July 2022.  She has had abdominal pain usually after meals lasting the whole day not relieved after bowel movement PPI Protonix 40 mg helps with the pain and reflux. s. 10/03/2020: EGD: Normal.  Biopsies of stomach were taken.  No abnormalities noted.  Colonoscopy performed on the same day.  Diverticulosis in the sigmoid colon noted.  Otherwise complete examination was normal.    Interval history 10/31/2020-09/10/2021   09/04/2021 seen by Mortimer Fries PA at [facility] clinic for nausea vomiting abdominal pain ongoing for 4 weeks had a CT scan on 08/24/2021 when she presented with left lower quadrant abdominal pain of 3 weeks duration that showed acute diverticulitis of the descending sigmoid junction without evidence of perforation or abscess.  A CT scan was repeated on 08/25/2021 that showed near complete resolution of the diverticulitis along the descending colon showed some evidence of epiploic appendagitis at the descending sigmoid colon without change.  Has had 2 rounds of antibiotics for diverticulitis  09/04/2021 creatinine 1.2 potassium 5.3, hemoglobin 13.2 g normal white cell count normal lipase came into the emergency room yesterday with lower abdominal pain and a repeat CT scan was performed showed no abnormalities.  ER contacted me and we discussed that it was best to stop her antibiotics as she had no evidence of diverticulitis and it is probably the antibiotics that were causing her to have nausea vomiting and diarrhea.  She states that the main complaint she is having presently is that she has been having heartburn  burning sensation in her throat and not having regular bowel movements.  Having a bowel movement every few days.  MiraLAX has been tried but has not helped.  Not taking any other medications.  She is presently taking Prilosec 40 mg once a day which has not helped.  The abdominal discomfort is generalized nonspecific nonradiating no clear aggravating or relieving factors.  Denies any NSAID use.  No nausea presently yesterday she had some nausea no vomiting.  Current Outpatient Medications  Medication Sig Dispense Refill   acetaminophen (TYLENOL) 650 MG CR tablet Take 650 mg by mouth every 8 (eight) hours as needed for pain.     albuterol (VENTOLIN HFA) 108 (90 Base) MCG/ACT inhaler Inhale 2 puffs into the lungs every 6 (six) hours as needed.     ALPRAZolam (XANAX) 0.25 MG tablet Take 1 tablet by mouth daily as needed.     amitriptyline (ELAVIL) 25 MG tablet Take 1 tablet daily by mouth.     celecoxib (CELEBREX) 200 MG capsule Take 200 mg by mouth 2 (two) times daily.     Cholecalciferol (VITAMIN D3) 10 MCG (400 UNIT) tablet Take 1 tablet by mouth daily.     dicyclomine (BENTYL) 10 MG capsule Take 1 capsule (10 mg total) by mouth 3 (three) times daily before meals. 21 capsule 0   famotidine (PEPCID) 20 MG tablet Take 1 tablet (20 mg total) by mouth 2 (two) times daily. 60 tablet 1   fluconazole (DIFLUCAN) 150 MG tablet Take 150 mg by mouth once.  olmesartan-hydrochlorothiazide (BENICAR HCT) 20-12.5 MG tablet Take 1 tablet by mouth daily.     omeprazole (PRILOSEC) 40 MG capsule Take 1 capsule (40 mg total) by mouth 2 (two) times daily. 180 capsule 3   ondansetron (ZOFRAN) 4 MG tablet Take 1 tablet (4 mg total) by mouth 3 (three) times daily as needed for nausea or vomiting. 30 tablet 1   ondansetron (ZOFRAN-ODT) 4 MG disintegrating tablet Take 1 tablet (4 mg total) by mouth every 8 (eight) hours as needed. 20 tablet 0   oxyCODONE (OXY IR/ROXICODONE) 5 MG immediate release tablet Take 1 tablet by  mouth every 8 (eight) hours as needed.     OZEMPIC, 0.25 OR 0.5 MG/DOSE, 2 MG/1.5ML SOPN Inject 0.25 mg into the skin once a week.     polyethylene glycol (MIRALAX) 17 g packet Take 17 g by mouth daily. 14 each 0   sertraline (ZOLOFT) 50 MG tablet Take 50 mg by mouth daily.     traZODone (DESYREL) 50 MG tablet Take 1 tablet by mouth daily.     No current facility-administered medications for this visit.    Allergies as of 09/10/2021 - Review Complete 09/10/2021  Allergen Reaction Noted   Allevyn adhesive [wound dressings] Other (See Comments) 06/04/2008   Aspirin Swelling 02/21/2015   Codeine Swelling 02/21/2015   Norco [hydrocodone-acetaminophen] Itching 02/21/2015   Percocet [oxycodone-acetaminophen] Hives 02/21/2015   Adhesive [tape] Rash 08/30/2019   Sulfa antibiotics Rash 06/04/2008   Wound dressing adhesive Rash 06/04/2008    ROS:  General: Negative for anorexia, weight loss, fever, chills, fatigue, weakness. ENT: Negative for hoarseness, difficulty swallowing , nasal congestion. CV: Negative for chest pain, angina, palpitations, dyspnea on exertion, peripheral edema.  Respiratory: Negative for dyspnea at rest, dyspnea on exertion, cough, sputum, wheezing.  GI: See history of present illness. GU:  Negative for dysuria, hematuria, urinary incontinence, urinary frequency, nocturnal urination.  Endo: Negative for unusual weight change.    Physical Examination:   BP (!) 157/80   Pulse 65   Temp 98.9 F (37.2 C) (Oral)   Wt 206 lb (93.4 kg)   BMI 36.49 kg/m   General: Well-nourished, well-developed in no acute distress.  Eyes: No icterus. Conjunctivae pink. Abdomen: Bowel sounds are normal, nontender, nondistended, no hepatosplenomegaly or masses, no abdominal bruits or hernia , no rebound or guarding.   Extremities: No lower extremity edema. No clubbing or deformities. Neuro: Alert and oriented x 3.  Grossly intact. Skin: Warm and dry, no jaundice.   Psych: Alert and  cooperative, normal mood and affect.   Imaging Studies: CT ABDOMEN PELVIS WO CONTRAST  Result Date: 08/25/2021 CLINICAL DATA:  71 year old female presents for evaluation of diverticulitis on previous imaging. EXAM: CT ABDOMEN AND PELVIS WITHOUT CONTRAST TECHNIQUE: Multidetector CT imaging of the abdomen and pelvis was performed following the standard protocol without IV contrast. RADIATION DOSE REDUCTION: This exam was performed according to the departmental dose-optimization program which includes automated exposure control, adjustment of the mA and/or kV according to patient size and/or use of iterative reconstruction technique. COMPARISON:  Comparison made with Aug 14, 2021. FINDINGS: Lower chest: Basilar atelectasis, no effusion or consolidative changes. Aortic atherosclerosis in the visible thoracic aorta. Scattered coronary artery calcifications of LEFT coronary circulation. Elevated RIGHT hemidiaphragm. Hepatobiliary: Smooth hepatic contours, post cholecystectomy without gross biliary duct dilation or lesion on noncontrast imaging. Pancreas: Normal contour.  No signs of inflammation. Spleen: Normal size and contour. Adrenals/Urinary Tract: 1.1 cm RIGHT adrenal adenoma. Mild LEFT adrenal thickening. No  follow-up recommended for this adrenal lesion in the absence of biochemical abnormalities. Cortical scarring of the bilateral kidneys is similar to prior imaging and is mild. No hydronephrosis. No nephrolithiasis. No ureteral calculi. Urinary bladder without signs of adjacent thickening or stranding. Stomach/Bowel: Colonic diverticulosis and diverticular changes. Near complete resolution of stranding seen adjacent to diverticular changes along the descending colon. Still with mild stranding. No free intraperitoneal air or signs of abscess. Evidence of prior epiploic appendagitis anterior to the descending/sigmoid junction without change. No acute small bowel process. No stranding adjacent to the stomach.  Vascular/Lymphatic: Aortic atherosclerosis. No sign of aneurysm. Smooth contour of the IVC. There is no gastrohepatic or hepatoduodenal ligament lymphadenopathy. No retroperitoneal or mesenteric lymphadenopathy. No pelvic sidewall lymphadenopathy. Atherosclerotic changes are mild. Limited assessment of vascular structures in the absence of intravenous contrast. Reproductive: Post hysterectomy without signs of adnexal mass. Other: No ascites.  Small fat containing umbilical hernia. Musculoskeletal: No acute process or destructive bone finding with spinal degenerative changes. IMPRESSION: 1. Near complete resolution of diverticulitis along the descending colon. 2. Evidence of prior epiploic appendagitis anterior to the descending/sigmoid junction without change. 3. 1.1 cm RIGHT adrenal adenoma. No imaging follow-up required for this finding in the absence of clinical signs of endocrinopathy. 4. Small fat containing umbilical hernia. 5. Aortic atherosclerosis and coronary artery calcifications. Aortic Atherosclerosis (ICD10-I70.0). Electronically Signed   By: Zetta Bills M.D.   On: 08/25/2021 13:07   CT ABDOMEN PELVIS W CONTRAST  Result Date: 09/09/2021 CLINICAL DATA:  Abdominal pain, nausea, vomiting EXAM: CT ABDOMEN AND PELVIS WITH CONTRAST TECHNIQUE: Multidetector CT imaging of the abdomen and pelvis was performed using the standard protocol following bolus administration of intravenous contrast. RADIATION DOSE REDUCTION: This exam was performed according to the departmental dose-optimization program which includes automated exposure control, adjustment of the mA and/or kV according to patient size and/or use of iterative reconstruction technique. CONTRAST:  146m OMNIPAQUE IOHEXOL 300 MG/ML  SOLN COMPARISON:  08/25/2021 FINDINGS: Lower chest: There is marked elevation of right hemidiaphragm. Linear densities in the right lower lung fields may suggest scarring or subsegmental atelectasis. Hepatobiliary: There  is fatty infiltration in the liver. There is no dilation of bile ducts. Surgical clips are seen in gallbladder fossa. Pancreas: No focal abnormality is seen. Spleen: Unremarkable. Adrenals/Urinary Tract: Nodularity in both adrenals as not changed significantly. There is no hydronephrosis. There are no renal or ureteral stones. There is 8 mm smooth marginated lesion in the anterior upper pole of right kidney, possibly hyperdense cyst. Similar finding was seen in the an earlier CT done on 02/19/2017. Urinary bladder is unremarkable. Stomach/Bowel: Stomach is unremarkable. Small bowel loops are not dilated. Appendix is not seen. There is no pericecal inflammation. Scattered diverticula are seen in the colon without signs of focal acute diverticulitis. Vascular/Lymphatic: Atherosclerotic plaques and calcifications are seen in the aorta and its major branches. Reproductive: Uterus is not seen. Other: There is no ascites or pneumoperitoneum. Musculoskeletal: Degenerative changes are noted in facet joints in the lumbar spine. Umbilical and paraumbilical hernias containing fat are noted. IMPRESSION: There is no evidence intestinal obstruction or pneumoperitoneum. There is no hydronephrosis. Diverticulosis of colon without signs of focal acute diverticulitis. Fatty liver. Other findings as described in the body of the report. Electronically Signed   By: PElmer PickerM.D.   On: 09/09/2021 12:14   CT Abdomen Pelvis W Contrast  Result Date: 08/14/2021 CLINICAL DATA:  LEFT lower quadrant abdominal pain for 3 weeks, history of diverticulitis and  intestinal blockage, near syncopal feeling EXAM: CT ABDOMEN AND PELVIS WITH CONTRAST TECHNIQUE: Multidetector CT imaging of the abdomen and pelvis was performed using the standard protocol following bolus administration of intravenous contrast. RADIATION DOSE REDUCTION: This exam was performed according to the departmental dose-optimization program which includes automated  exposure control, adjustment of the mA and/or kV according to patient size and/or use of iterative reconstruction technique. CONTRAST:  80m OMNIPAQUE IOHEXOL 300 MG/ML SOLN IV. No oral contrast. COMPARISON:  02/19/2017 FINDINGS: Lower chest: Lung bases clear Hepatobiliary: Gallbladder surgically absent. Liver normal appearance. Pancreas: Normal appearance Spleen: Normal appearance Adrenals/Urinary Tract: Thickening of adrenal glands bilaterally with RIGHT adrenal nodule 12 x 10 mm image 32 measuring 41 HU on portal venous phase dropping to 24 HU on delayed images; this represents a probable adrenal adenoma and follow-up adrenal washout CT is recommended in 1 year. Kidneys, ureters, and bladder normal appearance. Stomach/Bowel: Appendix not visualized. Diverticulosis of descending and sigmoid colon with wall thickening and pericolic infiltrative changes at the descending sigmoid junction consistent with acute diverticulitis. No extraluminal gas or abscess. Stomach and remaining bowel loops normal appearance. Vascular/Lymphatic: Vascular structures patent. Aorta normal caliber with scattered atherosclerotic calcifications aorta and iliac arteries. No adenopathy. Reproductive: Uterus surgically absent with atrophic ovaries bilaterally Other: No free air or free fluid. Small infraumbilical ventral hernia containing fat. Musculoskeletal: No acute osseous findings. IMPRESSION: Acute diverticulitis at the descending sigmoid junction without evidence of perforation or abscess. Small infraumbilical ventral hernia containing fat. 12 x 10 mm RIGHT adrenal nodule, likely adrenal adenoma; follow-up adrenal washout CT recommended in 1 year. This follows the guidelines established in [facility] Aug; 14(8):1038-44, JCAT 2016 Mar-Apr; 40(2):194-200, Urol J 2006 Spring; 3(2):71-4. Aortic Atherosclerosis (ICD10-I70.0). Electronically Signed   By: MLavonia DanaM.D.   On: 08/14/2021 18:29    Assessment and Plan:   CMELAYSIA STREEDis a  71y.o. y/o female is here at the office today for symptoms of heartburn abdominal discomfort and constipation.  Recent episode of diverticulitis was given 3 course of antibiotics which she has stopped a few days.  Recent ER visit yesterday for the symptoms and the CT scan showed no evidence of diverticulitis and no other acute findings.  My impression is that she has developed worsening of her acid reflux, abdominal discomfort is a combination of acid reflux and constipation.  Plan 1.  As needed Bentyl for spasms prescription sent 2.  Increase dose of Prilosec from 40 mg once a day to 40 mg twice a day 3.  Trial of Linzess 145 mcg/day samples have been provided she has been instructed to call my office if it helps to be given a prescription. 4.  Continue lifestyle changes for reflux. 5.  If these interventions do not help she has been advised to call my office and at that point we will need to perform an upper endoscopy    Dr KJonathon Bellows MD,MRCP ([facility] Follow up in as needed

## 2021-09-29 ENCOUNTER — Telehealth: Payer: Self-pay

## 2021-09-29 NOTE — Telephone Encounter (Signed)
Patient called stating that she continues to have left upper abdominal pain and constipation even on Linzess 145 MCG daily. She stated that she also gets nauseated.  Patient will have her EGD done on 10/03/2021.

## 2021-09-30 MED ORDER — LINACLOTIDE 290 MCG PO CAPS: 290.0000 ug | ORAL_CAPSULE | Freq: Every day | ORAL | 3 refills | Status: DC

## 2021-09-30 NOTE — Telephone Encounter (Signed)
Stephanie Bellows, MD     1. How many bowel movements she having per week and is it still hard- if yes increase linzess to 290    Called patient and asked her how many bowel movements she is having in a week. She stated that she has a bowel movement daily but that it continues to be hard at times. I told her that Dr. Vicente Males recommends for her to in crease the dosage of her Linzess. Patient agreed and had no further questions.

## 2021-10-02 ENCOUNTER — Telehealth: Payer: Self-pay

## 2021-10-02 DIAGNOSIS — R109 Unspecified abdominal pain: Secondary | ICD-10-CM

## 2021-10-02 NOTE — Telephone Encounter (Signed)
Patient stated that she is able to have a bowel movement daily and she is already taking Linzess 290 mcg a day. What she stated that she wanted to know is what can she do whenever she wants to belch but can not. Patient stated that sometimes she constantly belching too. Therefore, she goes both ways but when she can not belch, she wants to know what to do because it really hurts her. Patient is also taking Omeprazole 40 MG daily.

## 2021-10-02 NOTE — Telephone Encounter (Signed)
Patient called stating that she started to have issues since she had a flare up of diverticulitis more than a month ago. Patient stated that she had done everything the doctor had told her to do and she is still feeling full, nauseated and now started to be burping. Patient declined fever, chills, diarrhea and constipation. Patient stated that she continues to take her Linzess daily. Patient also stated that she is not doing anything different. Patient would like to know what she could do or take to "fix" her. Please advise.

## 2021-10-06 NOTE — Telephone Encounter (Signed)
Patient called back stating that she is still having abdominal pain and nausea despite taking her Linzess daily, charcoal tablets and doing the Low FODMAP diet. Patient stated that her stomach hurts if she eats or if she doesn't eat. She wanted to know if this time around she was having a diverticulitis flare-up since her last CT Scan (09/09/2021) stated that she had diverticulosis. Patient stated that she is sick and tired of feeling this way and wanted to know if there is any labs or imaging that will help the physician diagnose her symptoms because she can't stand it any longer. Please advise.

## 2021-10-07 ENCOUNTER — Telehealth: Payer: Self-pay | Admitting: Gastroenterology

## 2021-10-09 LAB — CBC WITH DIFFERENTIAL/PLATELET
Basophils Absolute: 0 10*3/uL (ref 0.0–0.2)
Basos: 1 %
EOS (ABSOLUTE): 0.1 10*3/uL (ref 0.0–0.4)
Eos: 1 %
Hematocrit: 38.4 % (ref 34.0–46.6)
Hemoglobin: 12.9 g/dL (ref 11.1–15.9)
Immature Grans (Abs): 0 10*3/uL (ref 0.0–0.1)
Immature Granulocytes: 0 %
Lymphocytes Absolute: 2.1 10*3/uL (ref 0.7–3.1)
Lymphs: 25 %
MCH: 30.2 pg (ref 26.6–33.0)
MCHC: 33.6 g/dL (ref 31.5–35.7)
MCV: 90 fL (ref 79–97)
Monocytes Absolute: 0.6 10*3/uL (ref 0.1–0.9)
Monocytes: 8 %
Neutrophils Absolute: 5.4 10*3/uL (ref 1.4–7.0)
Neutrophils: 65 %
Platelets: 246 10*3/uL (ref 150–450)
RBC: 4.27 x10E6/uL (ref 3.77–5.28)
RDW: 12.9 % (ref 11.7–15.4)
WBC: 8.3 10*3/uL (ref 3.4–10.8)

## 2021-10-09 LAB — COMPREHENSIVE METABOLIC PANEL
ALT: 13 IU/L (ref 0–32)
AST: 13 IU/L (ref 0–40)
Albumin/Globulin Ratio: 1.7 (ref 1.2–2.2)
Albumin: 4.5 g/dL (ref 3.7–4.7)
Alkaline Phosphatase: 113 IU/L (ref 44–121)
BUN/Creatinine Ratio: 17 (ref 12–28)
BUN: 19 mg/dL (ref 8–27)
Bilirubin Total: 0.3 mg/dL (ref 0.0–1.2)
CO2: 21 mmol/L (ref 20–29)
Calcium: 9.2 mg/dL (ref 8.7–10.3)
Chloride: 102 mmol/L (ref 96–106)
Creatinine, Ser: 1.12 mg/dL — ABNORMAL HIGH (ref 0.57–1.00)
Globulin, Total: 2.6 g/dL (ref 1.5–4.5)
Glucose: 105 mg/dL — ABNORMAL HIGH (ref 70–99)
Potassium: 4.6 mmol/L (ref 3.5–5.2)
Sodium: 142 mmol/L (ref 134–144)
Total Protein: 7.1 g/dL (ref 6.0–8.5)
eGFR: 53 mL/min/{1.73_m2} — ABNORMAL LOW (ref >59)

## 2021-10-09 LAB — C-REACTIVE PROTEIN: CRP: 8 mg/L (ref 0–10)

## 2021-10-10 NOTE — Telephone Encounter (Signed)
Patient stated that she continues to have epigastric and LLQ abdominal pain and wanted to know her lab results. Patient was informed about her labs. Patient stated that she can not continue to live her life this way with constant pain and not able to eat. Patient stated that she continued to have pain when she would eat or if she didn't eat. Patient stated that she is able to have bowel movements since she's been taking Linzess but she just doesn't understand why she continues with this pain. Therefore, I told her that I would schedule her an appointment to be seen by Dr. Vicente Males and patient agreed.

## 2021-10-15 ENCOUNTER — Ambulatory Visit: Payer: Medicare PPO | Admitting: Gastroenterology

## 2021-10-15 ENCOUNTER — Encounter: Payer: Self-pay | Admitting: Gastroenterology

## 2021-10-15 VITALS — BP 142/78 | HR 68 | Temp 98.2°F | Ht 63.0 in | Wt 207.2 lb

## 2021-10-15 DIAGNOSIS — K5900 Constipation, unspecified: Secondary | ICD-10-CM | POA: Diagnosis not present

## 2021-10-15 DIAGNOSIS — R110 Nausea: Secondary | ICD-10-CM | POA: Diagnosis not present

## 2021-10-15 NOTE — Progress Notes (Signed)
Jonathon Bellows MD, MRCP(U.K) [address]  [place], [place] [address]  Main: [phone]  Fax: [phone]   Primary Care Physician: Rusty Aus, MD  Primary Gastroenterologist:  Dr. Jonathon Bellows   Chief Complaint  Patient presents with   Follow-up    HPI: [name] is a 71 y.o. female  Summary of history :     Last seen at my office in July 2022.  She has had abdominal pain usually after meals lasting the whole day not relieved after bowel movement PPI Protonix 40 mg helps with the pain and reflux. s. 10/03/2020: EGD: Normal.  Biopsies of stomach were taken.  No abnormalities noted.  Colonoscopy performed on the same day.  Diverticulosis in the sigmoid colon noted.  Otherwise complete examination was normal.   09/04/2021 seen by Mortimer Fries PA at [facility] clinic for nausea vomiting abdominal pain ongoing for 4 weeks had a CT scan on 08/24/2021 when she presented with left lower quadrant abdominal pain of 3 weeks duration that showed acute diverticulitis of the descending sigmoid junction without evidence of perforation or abscess.  A CT scan was repeated on 08/25/2021 that showed near complete resolution of the diverticulitis along the descending colon showed some evidence of epiploic appendagitis at the descending sigmoid colon without change.  Has had 2 rounds of antibiotics for diverticulitis  09/04/2021 creatinine 1.2 potassium 5.3, hemoglobin 13.2 g normal white cell count normal lipase came into the emergency room yesterday with lower abdominal pain and a repeat CT scan was performed showed no abnormalities.  ER contacted me and we discussed that it was best to stop her antibiotics as she had no evidence of diverticulitis and it is probably the antibiotics that were causing her to have nausea vomiting and diarrhea.    Interval history   Since last visit has not had much issues with her bowel movements.  On Linzess 290 mcg a day.  Still has chronic nausea.   She is on a PPI.  She has been exposed to marijuana.  I explained to her that marijuana exposure can cause chronic nausea    Current Outpatient Medications  Medication Sig Dispense Refill   acetaminophen (TYLENOL) 650 MG CR tablet Take 650 mg by mouth every 8 (eight) hours as needed for pain.     albuterol (VENTOLIN HFA) 108 (90 Base) MCG/ACT inhaler Inhale 2 puffs into the lungs every 6 (six) hours as needed.     ALPRAZolam (XANAX) 0.25 MG tablet Take 1 tablet by mouth daily as needed.     amitriptyline (ELAVIL) 25 MG tablet Take 1 tablet daily by mouth.     celecoxib (CELEBREX) 200 MG capsule Take 200 mg by mouth 2 (two) times daily.     Cholecalciferol (VITAMIN D3) 10 MCG (400 UNIT) tablet Take 1 tablet by mouth daily.     dicyclomine (BENTYL) 10 MG capsule Take 1 capsule (10 mg total) by mouth 3 (three) times daily before meals. 90 capsule 2   famotidine (PEPCID) 20 MG tablet Take 1 tablet (20 mg total) by mouth 2 (two) times daily. 60 tablet 1   fluconazole (DIFLUCAN) 150 MG tablet Take 150 mg by mouth once.     linaclotide (LINZESS) 290 MCG CAPS capsule Take 1 capsule (290 mcg total) by mouth daily before breakfast. 90 capsule 3   olmesartan-hydrochlorothiazide (BENICAR HCT) 20-12.5 MG tablet Take 1 tablet by mouth daily.     omeprazole (PRILOSEC) 40 MG capsule Take  1 capsule (40 mg total) by mouth 2 (two) times daily. 180 capsule 3   ondansetron (ZOFRAN) 4 MG tablet Take 1 tablet (4 mg total) by mouth 3 (three) times daily as needed for nausea or vomiting. 30 tablet 1   ondansetron (ZOFRAN-ODT) 4 MG disintegrating tablet Take 1 tablet (4 mg total) by mouth every 8 (eight) hours as needed. 20 tablet 0   oxyCODONE (OXY IR/ROXICODONE) 5 MG immediate release tablet Take 1 tablet by mouth every 8 (eight) hours as needed.     OZEMPIC, 0.25 OR 0.5 MG/DOSE, 2 MG/1.5ML SOPN Inject 0.25 mg into the skin once a week.     polyethylene glycol (MIRALAX) 17 g packet Take 17 g by mouth daily. 14 each 0    sertraline (ZOLOFT) 50 MG tablet Take 50 mg by mouth daily.     traZODone (DESYREL) 50 MG tablet Take 1 tablet by mouth daily.     No current facility-administered medications for this visit.    Allergies as of 10/15/2021 - Review Complete 10/15/2021  Allergen Reaction Noted   Allevyn adhesive [wound dressings] Other (See Comments) 06/04/2008   Aspirin Swelling 02/21/2015   Codeine Swelling 02/21/2015   Norco [hydrocodone-acetaminophen] Itching 02/21/2015   Percocet [oxycodone-acetaminophen] Hives 02/21/2015   Adhesive [tape] Rash 08/30/2019   Sulfa antibiotics Rash 06/04/2008   Wound dressing adhesive Rash 06/04/2008    ROS:  General: Negative for anorexia, weight loss, fever, chills, fatigue, weakness. ENT: Negative for hoarseness, difficulty swallowing , nasal congestion. CV: Negative for chest pain, angina, palpitations, dyspnea on exertion, peripheral edema.  Respiratory: Negative for dyspnea at rest, dyspnea on exertion, cough, sputum, wheezing.  GI: See history of present illness. GU:  Negative for dysuria, hematuria, urinary incontinence, urinary frequency, nocturnal urination.  Endo: Negative for unusual weight change.    Physical Examination:   BP (!) 142/78 (BP Location: Left Arm, Patient Position: Sitting, Cuff Size: Large)   Pulse 68   Temp 98.2 F (36.8 C) (Oral)   Ht '5\' 3"'$  (1.6 m)   Wt 207 lb 3.2 oz (94 kg)   BMI 36.70 kg/m   General: Well-nourished, well-developed in no acute distress.  Eyes: No icterus. Conjunctivae pink. Mouth: Oropharyngeal mucosa moist and pink , no lesions erythema or exudate. Neuro: Alert and oriented x 3.  Grossly intact. Skin: Warm and dry, no jaundice.   Psych: Alert and cooperative, normal mood and affect.   Imaging Studies: No results found.  Assessment and Plan:   [name] is a 71 y.o. y/o female here at the office today for symptoms of heartburn abdominal discomfort and constipation.  Recent episode of  diverticulitis was given 3 course of antibiotics s.  She has been suffering from chronic nausea as well as abdominal discomfort.  Empirically being treated for acid reflux, constipation, functional dyspepsia.  At this point of time I only thoughts are that exposure to marijuana which is positive from her history is causing the chronic nausea I advised her to stop this exposure to see if she feels better.  If she does not to proceed with a second opinion at [facility] or [facility] to see if they have any other thoughts.    Dr Jonathon Bellows  MD,MRCP [facility]) Follow up in as needed

## 2021-10-20 ENCOUNTER — Other Ambulatory Visit: Payer: Self-pay

## 2021-10-20 ENCOUNTER — Emergency Department
Admission: EM | Admit: 2021-10-20 | Discharge: 2021-10-20 | Disposition: A | Discharge status: Home / Self Care | Payer: Medicare PPO | Attending: Emergency Medicine | Admitting: Emergency Medicine

## 2021-10-20 ENCOUNTER — Emergency Department: Payer: Medicare PPO

## 2021-10-20 DIAGNOSIS — W208XXA Other cause of strike by thrown, projected or falling object, initial encounter: Secondary | ICD-10-CM | POA: Insufficient documentation

## 2021-10-20 DIAGNOSIS — S99922A Unspecified injury of left foot, initial encounter: Secondary | ICD-10-CM | POA: Diagnosis present

## 2021-10-20 DIAGNOSIS — S97112A Crushing injury of left great toe, initial encounter: Secondary | ICD-10-CM | POA: Diagnosis not present

## 2021-10-20 DIAGNOSIS — Y9389 Activity, other specified: Secondary | ICD-10-CM | POA: Diagnosis not present

## 2021-10-20 DIAGNOSIS — M7989 Other specified soft tissue disorders: Secondary | ICD-10-CM | POA: Diagnosis not present

## 2021-10-20 NOTE — Discharge Instructions (Addendum)
Please use ibuprofen (Motrin) up to 800 mg every 8 hours, naproxen (Naprosyn) up to 500 mg every 12 hours, and/or acetaminophen (Tylenol) up to 4 g/day for any continued pain 

## 2021-10-20 NOTE — ED Provider Notes (Signed)
[facility] Provider Note   Event Date/Time   First MD Initiated Contact with Patient 10/20/21 [phone]     (approximate) History  Foot Injury  HPI [name] is a 71 y.o. 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 when she dropped the corner of it onto the top of her left great toe Modifying factors: Walking worsens her pain and is partially relieved at rest, with elevation, and with naproxen Associated Symptoms: Denies ROS: Patient currently denies any vision changes, tinnitus, difficulty speaking, facial droop, sore throat, chest pain, shortness of breath, abdominal pain, nausea/vomiting/diarrhea, dysuria, or weakness/numbness/paresthesias in any extremity   Physical Exam  Triage Vital Signs: ED Triage Vitals  Enc Vitals Group     BP 10/20/21 0707 (!) 161/61     Pulse Rate 10/20/21 0707 74     Resp 10/20/21 0707 20     Temp 10/20/21 0707 97.6 F (36.4 C)     Temp Source 10/20/21 0707 Oral     SpO2 10/20/21 0707 95 %     Weight 10/20/21 0717 207 lb 0.2 oz (93.9 kg)     Height 10/20/21 0717 '5\' 3"'$  (1.6 m)     Head Circumference --      Peak Flow --      Pain Score 10/20/21 0703 10     Pain Loc --      Pain Edu? --      Excl. in [place]? --    Most recent vital signs: Vitals:   10/20/21 0707  BP: (!) 161/61  Pulse: 74  Resp: 20  Temp: 97.6 F (36.4 C)  SpO2: 95%   General: Awake, oriented x4. CV:  Good peripheral perfusion.  Resp:  Normal effort.  Abd:  No distention.  Other:  Elderly African-American female laying in bed in no acute distress.  Erythema and tenderness to palpation over the first the joint of the great toe of the left foot ED Results / Procedures / Treatments  RADIOLOGY ED MD interpretation: Three-view x-ray of the left foot interpreted by me shows no  evidence of acute abnormalities -Agree with radiology assessment Official radiology report(s): DG Foot Complete Left  Result Date: 10/20/2021 CLINICAL DATA:  71 year old female history of left-sided foot pain and swelling. EXAM: LEFT FOOT - COMPLETE 3+ VIEW COMPARISON:  No priors. FINDINGS: There is no evidence of fracture or dislocation. There is no evidence of arthropathy or other focal bone abnormality. Soft tissues are unremarkable. IMPRESSION: Negative. Electronically Signed   By: Vinnie Langton M.D.   On: 10/20/2021 07:36   PROCEDURES: Critical Care performed: No Procedures MEDICATIONS ORDERED IN ED: Medications - No data to display IMPRESSION / MDM / [place] / ED COURSE  I reviewed the triage vital signs and the nursing notes.                             The patient is on the cardiac monitor to evaluate for evidence of arrhythmia and/or significant heart rate changes. Patient's presentation is most consistent with acute presentation with potential threat to life or bodily function. 71 year old female presents after a walker dropped onto the great toe of her left foot and has had continued pain for the last 5 days Given history, exam and workup  I have low suspicion for fracture, dislocation, significant ligamentous injury, septic arthritis, gout flare, new autoimmune arthropathy, or gonococcal arthropathy.  Interventions: X-ray of the left foot does not show any evidence of acute abnormalities -Walker, Ace wrap, and RICE instructions -Provided follow-up with sports medicine if symptoms do not improve Disposition: Discharge home with strict return precautions and instructions for prompt primary care follow up in the next week.   FINAL CLINICAL IMPRESSION(S) / ED DIAGNOSES   Final diagnoses:  Crushing injury of left great toe, initial encounter   Rx / DC Orders   ED Discharge Orders     None      Note:  This document was prepared using Dragon voice recognition  software and may include unintentional dictation errors.   Naaman Plummer, MD 10/20/21 [phone]

## 2021-10-20 NOTE — ED Notes (Signed)
See triage note  Presents with pain to left foot  states she dropped a walker on it a few days ago  Cont's to have pain   No deformity noted  Good pulses

## 2021-10-20 NOTE — ED Triage Notes (Signed)
Pt c/o left foot pain with swelling after dropping a walker on it , states it has been hurting for the past couple of days

## 2021-10-31 ENCOUNTER — Telehealth: Payer: Self-pay

## 2021-10-31 NOTE — Telephone Encounter (Signed)
[facility] sent [place] a letter stating that they are not able to schedule an appointment for the patient at this time so to refer her to another location for continuation of care. Letter will be under Media tab. I will send her referral to [place] for a second opinion and patient agreed. Once they receive the referral, they contact the patient to schedule an appointment.

## 2021-11-19 NOTE — Telephone Encounter (Signed)
Dr. Vicente Males, we receieved a letter from [facility] and they also declined to see your patient for a second opinion. Please let me know what else to do.

## 2021-12-02 NOTE — Telephone Encounter (Signed)
Patient verbalized understanding about referral. She would like to be sent to [facility]. I have placed referral to [facility]

## 2022-02-02 IMAGING — NM NM HEPATOBILIARY IMAGE, INC GB
1 series · 6 of 6 positions shown · non-contrast
Comparison: None.

CLINICAL DATA: Abdominal pain

EXAM:
NUCLEAR MEDICINE HEPATOBILIARY IMAGING
TECHNIQUE: Sequential images of the abdomen were obtained [DATE] minutes
following intravenous administration of radiopharmaceutical.
RADIOPHARMACEUTICALS:  4.89 mCi 4c-99m  Choletec IV

[Series 1000: hepatobiliary scan · 9.59mm/px · 6 of 60 frames shown]
[frame 6/60]
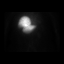
[frame 16/60]
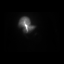
[frame 26/60]
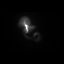
[frame 36/60]
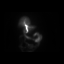
[frame 46/60]
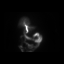
[frame 56/60]
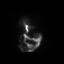

[6 of 6 positions shown; findings below may reference images not displayed]

FINDINGS: Prompt uptake and biliary excretion of activity by the liver is
seen. Status post cholecystectomy. Biliary activity passes into
small bowel, consistent with patent common bile duct.
IMPRESSION: 1.  The common bile duct is patent.

2.  Status post cholecystectomy.

## 2022-08-05 ENCOUNTER — Other Ambulatory Visit: Payer: Self-pay | Admitting: Internal Medicine

## 2022-08-05 DIAGNOSIS — Z1231 Encounter for screening mammogram for malignant neoplasm of breast: Secondary | ICD-10-CM

## 2022-08-19 ENCOUNTER — Other Ambulatory Visit: Payer: Self-pay | Admitting: Gastroenterology

## 2022-09-18 ENCOUNTER — Ambulatory Visit
Admission: RE | Admit: 2022-09-18 | Discharge: 2022-09-18 | Disposition: A | Discharge status: Home / Self Care | Payer: Medicare PPO | Source: Ambulatory Visit | Attending: Internal Medicine | Admitting: Internal Medicine

## 2022-09-18 DIAGNOSIS — Z1231 Encounter for screening mammogram for malignant neoplasm of breast: Secondary | ICD-10-CM | POA: Insufficient documentation

## 2023-04-10 ENCOUNTER — Emergency Department
Admission: EM | Admit: 2023-04-10 | Discharge: 2023-04-10 | Disposition: A | Discharge status: Home / Self Care | Payer: Medicare PPO | Attending: Emergency Medicine | Admitting: Emergency Medicine

## 2023-04-10 ENCOUNTER — Other Ambulatory Visit: Payer: Self-pay

## 2023-04-10 ENCOUNTER — Encounter: Payer: Self-pay | Admitting: Emergency Medicine

## 2023-04-10 ENCOUNTER — Emergency Department: Payer: Medicare PPO

## 2023-04-10 DIAGNOSIS — K5732 Diverticulitis of large intestine without perforation or abscess without bleeding: Secondary | ICD-10-CM | POA: Insufficient documentation

## 2023-04-10 DIAGNOSIS — I10 Essential (primary) hypertension: Secondary | ICD-10-CM | POA: Diagnosis not present

## 2023-04-10 DIAGNOSIS — R1032 Left lower quadrant pain: Secondary | ICD-10-CM | POA: Diagnosis present

## 2023-04-10 DIAGNOSIS — K5792 Diverticulitis of intestine, part unspecified, without perforation or abscess without bleeding: Secondary | ICD-10-CM

## 2023-04-10 LAB — COMPREHENSIVE METABOLIC PANEL
ALT: 16 U/L (ref 0–44)
AST: 16 U/L (ref 15–41)
Albumin: 4.1 g/dL (ref 3.5–5.0)
Alkaline Phosphatase: 109 U/L (ref 38–126)
Anion gap: 9 (ref 5–15)
BUN: 20 mg/dL (ref 8–23)
CO2: 25 mmol/L (ref 22–32)
Calcium: 9.3 mg/dL (ref 8.9–10.3)
Chloride: 104 mmol/L (ref 98–111)
Creatinine, Ser: 0.86 mg/dL (ref 0.44–1.00)
GFR, Estimated: >60 mL/min (ref >60)
Glucose, Bld: 114 mg/dL — ABNORMAL HIGH (ref 70–99)
Potassium: 4.2 mmol/L (ref 3.5–5.1)
Sodium: 138 mmol/L (ref 135–145)
Total Bilirubin: 0.9 mg/dL (ref <1.2)
Total Protein: 7.3 g/dL (ref 6.5–8.1)

## 2023-04-10 LAB — CBC
HCT: 39.3 % (ref 36.0–46.0)
Hemoglobin: 12.7 g/dL (ref 12.0–15.0)
MCH: 30 pg (ref 26.0–34.0)
MCHC: 32.3 g/dL (ref 30.0–36.0)
MCV: 92.7 fL (ref 80.0–100.0)
Platelets: 216 10*3/uL (ref 150–400)
RBC: 4.24 MIL/uL (ref 3.87–5.11)
RDW: 13.2 % (ref 11.5–15.5)
WBC: 11.1 10*3/uL — ABNORMAL HIGH (ref 4.0–10.5)
nRBC: 0 % (ref 0.0–0.2)

## 2023-04-10 LAB — URINALYSIS, ROUTINE W REFLEX MICROSCOPIC
Bacteria, UA: NONE SEEN
Bilirubin Urine: NEGATIVE
Glucose, UA: NEGATIVE mg/dL
Hgb urine dipstick: NEGATIVE
Ketones, ur: NEGATIVE mg/dL
Leukocytes,Ua: NEGATIVE
Nitrite: NEGATIVE
Protein, ur: 100 mg/dL — AB
Specific Gravity, Urine: 1.015 (ref 1.005–1.030)
pH: 5 (ref 5.0–8.0)

## 2023-04-10 LAB — LIPASE, BLOOD: Lipase: 24 U/L (ref 11–51)

## 2023-04-10 MED ORDER — CIPROFLOXACIN HCL 500 MG PO TABS: 500.0000 mg | ORAL_TABLET | Freq: Two times a day (BID) | ORAL | 0 refills | Status: AC

## 2023-04-10 MED ORDER — IOHEXOL 300 MG/ML  SOLN
100.0000 mL | Freq: Once | INTRAMUSCULAR | Status: AC | PRN
  Administered 2023-04-10: 100 mL via INTRAVENOUS

## 2023-04-10 MED ORDER — METRONIDAZOLE 500 MG PO TABS
500.0000 mg | ORAL_TABLET | Freq: Once | ORAL | Status: AC
  Administered 2023-04-10: 500 mg via ORAL
  Filled 2023-04-10: qty 1

## 2023-04-10 MED ORDER — KETOROLAC TROMETHAMINE 30 MG/ML IJ SOLN
30.0000 mg | Freq: Once | INTRAMUSCULAR | Status: AC
  Administered 2023-04-10: 30 mg via INTRAVENOUS
  Filled 2023-04-10: qty 1

## 2023-04-10 MED ORDER — FENTANYL CITRATE PF 50 MCG/ML IJ SOSY
50.0000 ug | PREFILLED_SYRINGE | INTRAMUSCULAR | Status: DC | PRN
  Administered 2023-04-10: 50 ug via INTRAVENOUS
  Filled 2023-04-10: qty 1

## 2023-04-10 MED ORDER — METOCLOPRAMIDE HCL 10 MG PO TABS: 10.0000 mg | ORAL_TABLET | Freq: Three times a day (TID) | ORAL | 0 refills | Status: DC

## 2023-04-10 MED ORDER — ONDANSETRON HCL 4 MG/2ML IJ SOLN
4.0000 mg | Freq: Once | INTRAMUSCULAR | Status: AC
  Administered 2023-04-10: 4 mg via INTRAVENOUS
  Filled 2023-04-10: qty 2

## 2023-04-10 MED ORDER — SIMETHICONE 40 MG/0.6ML PO SUSP (UNIT DOSE)
40.0000 mg | Freq: Once | ORAL | Status: AC
  Administered 2023-04-10: 40 mg via ORAL
  Filled 2023-04-10: qty 0.6
  Filled 2023-04-10: qty 30

## 2023-04-10 MED ORDER — CIPROFLOXACIN HCL 500 MG PO TABS
500.0000 mg | ORAL_TABLET | Freq: Once | ORAL | Status: AC
  Administered 2023-04-10: 500 mg via ORAL
  Filled 2023-04-10: qty 1

## 2023-04-10 MED ORDER — METRONIDAZOLE 500 MG PO TABS: 500.0000 mg | ORAL_TABLET | Freq: Three times a day (TID) | ORAL | 0 refills | Status: AC

## 2023-04-10 MED ORDER — DICYCLOMINE HCL 10 MG PO CAPS
10.0000 mg | ORAL_CAPSULE | Freq: Once | ORAL | Status: AC
  Administered 2023-04-10: 10 mg via ORAL
  Filled 2023-04-10: qty 1

## 2023-04-10 MED ORDER — METOCLOPRAMIDE HCL 5 MG/ML IJ SOLN
10.0000 mg | Freq: Once | INTRAMUSCULAR | Status: AC
  Administered 2023-04-10: 10 mg via INTRAVENOUS
  Filled 2023-04-10: qty 2

## 2023-04-10 NOTE — ED Provider Notes (Signed)
[facility] Emergency Department Provider Note     Event Date/Time   First MD Initiated Contact with Patient 04/10/23 1703     (approximate)   History   Abdominal Pain   HPI  [name] is a 72 y.o. female with a history of diverticulosis, hypertension, HLD, and GERD, presents to the ED for evaluation of left lower quadrant pain.  Patient endorsed worsening symptoms over the last week.  She endorses nausea with vomiting as well as poor appetite.  She is also endorsing some subjective fevers.  Patient had a normal BM yesterday.  Physical Exam   Triage Vital Signs: ED Triage Vitals  Encounter Vitals Group     BP 04/10/23 1302 135/69     Systolic BP Percentile --      Diastolic BP Percentile --      Pulse Rate 04/10/23 1302 70     Resp 04/10/23 1302 18     Temp 04/10/23 1302 98.5 F (36.9 C)     Temp Source 04/10/23 1302 Oral     SpO2 04/10/23 1302 97 %     Weight 04/10/23 1304 180 lb (81.6 kg)     Height --      Head Circumference --      Peak Flow --      Pain Score 04/10/23 1303 10     Pain Loc --      Pain Education --      Exclude from Growth Chart --     Most recent vital signs: Vitals:   04/10/23 1302 04/10/23 1817  BP: 135/69 (!) 165/82  Pulse: 70 (!) 59  Resp: 18 18  Temp: 98.5 F (36.9 C) 97.9 F (36.6 C)  SpO2: 97% 98%    General Awake, no distress. NAD HEENT NCAT. PERRL. EOMI. No rhinorrhea. Mucous membranes are moist.  CV:  Good peripheral perfusion. RRR RESP:  Normal effort. CTA ABD:  No distention.  Generally soft and nontender to palpation.  Normoactive bowel sounds x 4.  No rebound, guarding, or rigidity noted.   ED Results / Procedures / Treatments   Labs (all labs ordered are listed, but only abnormal results are displayed) Labs Reviewed  COMPREHENSIVE METABOLIC PANEL - Abnormal; Notable for the following components:      Result Value   Glucose, Bld 114 (*)    All other components within normal limits   CBC - Abnormal; Notable for the following components:   WBC 11.1 (*)    All other components within normal limits  URINALYSIS, ROUTINE W REFLEX MICROSCOPIC - Abnormal; Notable for the following components:   Color, Urine YELLOW (*)    APPearance CLEAR (*)    Protein, ur 100 (*)    All other components within normal limits  LIPASE, BLOOD     EKG   RADIOLOGY  I personally viewed and evaluated these images as part of my medical decision making, as well as reviewing the written report by the radiologist.  ED Provider Interpretation: Evidence of acute diverticulitis  CT ABDOMEN PELVIS W CONTRAST Result Date: 04/10/2023 CLINICAL DATA:  Left lower quadrant abdominal pain. Nausea/vomiting. EXAM: CT ABDOMEN AND PELVIS WITH CONTRAST TECHNIQUE: Multidetector CT imaging of the abdomen and pelvis was performed using the standard protocol following bolus administration of intravenous contrast. RADIATION DOSE REDUCTION: This exam was performed according to the departmental dose-optimization program which includes automated exposure control, adjustment of the mA and/or kV according to patient size and/or use of  iterative reconstruction technique. CONTRAST:  OMNIPAQUE IOHEXOL 300 MG/ML  SOLN COMPARISON:  CT scan abdomen and pelvis from 09/09/2021. FINDINGS: Lower chest: The lung bases are clear. No pleural effusion. The heart is normal in size. No pericardial effusion. Hepatobiliary: The liver is normal in size. Non-cirrhotic configuration. No suspicious mass. No intrahepatic or extrahepatic bile duct dilation. Gallbladder is surgically absent. Pancreas: Unremarkable. No pancreatic ductal dilatation or surrounding inflammatory changes. Spleen: Within normal limits. No focal lesion. Adrenals/Urinary Tract: There is stable nodularity of bilateral adrenal glands. No suspicious renal mass. There are at least 3 hypoattenuating structures in the right kidney with largest in the lower pole anteriorly  measuring up to 9 x 10 mm, which are too small to adequately characterize. Urinary bladder is under distended, precluding optimal assessment. However, no large mass or stones identified. No perivesical fat stranding. Stomach/Bowel: There is short segment of colon (approximately 4 cm), at the level of junction of descending colon and proximal sigmoid colon, exhibiting mild irregular wall thickening and pericolonic fat stranding on the background of several diverticula, compatible with acute uncomplicated diverticulitis. No walled off abscess or loculated collection. No pneumoperitoneum. No disproportionate dilation of small or large bowel loops to suggest bowel obstruction. The appendix is not distinctly visualized however, there are no inflammatory changes in the right lower quadrant. Vascular/Lymphatic: No ascites or pneumoperitoneum. No abdominal or pelvic lymphadenopathy, by size criteria. No aneurysmal dilation of the major abdominal arteries. There are mild peripheral atherosclerotic vascular calcifications of the aorta and its major branches. Reproductive: The uterus is surgically absent. No large adnexal mass. Other: There is a small fat containing infraumbilical midline ventral hernia. The soft tissues and abdominal wall are otherwise unremarkable. Musculoskeletal: No suspicious osseous lesions. There are mild multilevel degenerative changes in the visualized spine. IMPRESSION: *Acute uncomplicated diverticulitis at the junction of descending colon and proximal sigmoid colon, as described above. *Multiple other nonacute observations, as described above. Aortic Atherosclerosis (ICD10-I70.0). Electronically Signed   By: Jules Schick M.D.   On: 04/10/2023 17:37     PROCEDURES:  Critical Care performed: No  Procedures   MEDICATIONS ORDERED IN ED: Medications  fentaNYL (SUBLIMAZE) injection 50 mcg (50 mcg Intravenous Given 04/10/23 1604)  simethicone (MYLICON) 40 mg/0.97ml suspension 40 mg (has no  administration in time range)  ciprofloxacin (CIPRO) tablet 500 mg (has no administration in time range)  metroNIDAZOLE (FLAGYL) tablet 500 mg (has no administration in time range)  ondansetron (ZOFRAN) injection 4 mg (4 mg Intravenous Given 04/10/23 1603)  iohexol (OMNIPAQUE) 300 MG/ML solution 100 mL (100 mLs Intravenous Contrast Given 04/10/23 1616)  metoCLOPramide (REGLAN) injection 10 mg (10 mg Intravenous Given 04/10/23 1807)  dicyclomine (BENTYL) capsule 10 mg (10 mg Oral Given 04/10/23 1810)  ketorolac (TORADOL) 30 MG/ML injection 30 mg (30 mg Intravenous Given 04/10/23 1805)     IMPRESSION / MDM / ASSESSMENT AND PLAN / ED COURSE  I reviewed the triage vital signs and the nursing notes.                              Differential diagnosis includes, but is not limited to, acute appendicitis, diverticulitis, urinary tract infection/pyelonephritis, bowel obstruction, colitis, renal colic, gastroenteritis, hernia, etc.   Patient's presentation is most consistent with acute complicated illness / injury requiring diagnostic workup.  Patient's diagnosis is consistent with diverticulitis.  Labs are overall reassuring with only a mild elevation to the white blood cells.  Lipase  and urine otherwise clear.  No electrolyte abnormalities noted.  CT does confirm acute noncomplicated diverticulitis.  Patient will be treated in the ED with empiric dose of Cipro and Reglan.  She is also given meds for her acute pain on the left lower quadrant.  She is stable at this time without ongoing discomfort.  Patient will be discharged home with prescriptions for Reglan, Cipro, and Flagyl. Patient is to follow up with her primary provider or GI specialist as discussed, as needed or otherwise directed. Patient is given ED precautions to return to the ED for any worsening or new symptoms.  FINAL CLINICAL IMPRESSION(S) / ED DIAGNOSES   Final diagnoses:  Diverticulitis     Rx / DC Orders   ED Discharge  Orders          Ordered    ciprofloxacin (CIPRO) 500 MG tablet  2 times daily        04/10/23 1810    metroNIDAZOLE (FLAGYL) 500 MG tablet  3 times daily        04/10/23 1810    metoCLOPramide (REGLAN) 10 MG tablet  3 times daily with meals        04/10/23 1830             Note:  This document was prepared using Dragon voice recognition software and may include unintentional dictation errors.    Lissa Hoard, PA-C 04/10/23 [phone]

## 2023-04-10 NOTE — ED Triage Notes (Addendum)
Presents from home for LLQ pain that has gotten progressively worse over a week.  Endorses N/V, poor appetite, fever/chills (subj) Denies blood in vomit, urinary complaint Last Bm yesterday H/o diverticulitis, htn

## 2023-04-10 NOTE — Discharge Instructions (Addendum)
Take the prescription antibiotics as directed.  Take the prescription nausea medicine as needed.  Continue to hydrate to prevent dehydration.  Follow-up with your primary provider or your GI specialist as discussed.  Return to the ED if needed.

## 2023-04-21 ENCOUNTER — Ambulatory Visit
Admission: RE | Admit: 2023-04-21 | Discharge: 2023-04-21 | Disposition: A | Discharge status: Home / Self Care | Payer: Medicare PPO | Source: Ambulatory Visit | Attending: Internal Medicine | Admitting: Internal Medicine

## 2023-04-21 ENCOUNTER — Other Ambulatory Visit: Payer: Self-pay | Admitting: Internal Medicine

## 2023-04-21 DIAGNOSIS — K651 Peritoneal abscess: Secondary | ICD-10-CM | POA: Insufficient documentation

## 2023-04-21 DIAGNOSIS — R1032 Left lower quadrant pain: Secondary | ICD-10-CM

## 2023-07-08 ENCOUNTER — Other Ambulatory Visit: Payer: Self-pay | Admitting: Internal Medicine

## 2023-07-08 ENCOUNTER — Ambulatory Visit
Admission: RE | Admit: 2023-07-08 | Discharge: 2023-07-08 | Disposition: A | Discharge status: Home / Self Care | Source: Ambulatory Visit | Attending: Internal Medicine | Admitting: Internal Medicine

## 2023-07-08 DIAGNOSIS — K5792 Diverticulitis of intestine, part unspecified, without perforation or abscess without bleeding: Secondary | ICD-10-CM | POA: Insufficient documentation

## 2023-07-08 DIAGNOSIS — K5720 Diverticulitis of large intestine with perforation and abscess without bleeding: Secondary | ICD-10-CM | POA: Insufficient documentation

## 2023-09-14 ENCOUNTER — Other Ambulatory Visit: Payer: Self-pay | Admitting: Internal Medicine

## 2023-09-14 DIAGNOSIS — Z1231 Encounter for screening mammogram for malignant neoplasm of breast: Secondary | ICD-10-CM

## 2023-09-23 ENCOUNTER — Ambulatory Visit
Admission: RE | Admit: 2023-09-23 | Discharge: 2023-09-23 | Disposition: A | Discharge status: Home / Self Care | Source: Ambulatory Visit | Attending: Internal Medicine | Admitting: Internal Medicine

## 2023-09-23 DIAGNOSIS — Z1231 Encounter for screening mammogram for malignant neoplasm of breast: Secondary | ICD-10-CM | POA: Insufficient documentation

## 2023-11-11 NOTE — Progress Notes (Signed)
 Patient Profile:   [name]  is a 73 y.o.  female Chief Complaint  Patient presents with  . Follow-up  . Abdominal Pain    Reports abdominal pain since last night. She feels her abdomen is swollen.  She had been constipated since Tuesday. She treated with Milk of Magnesia, Last night she had frequent bowel movements.  Pain reported at 10.      PROBLEM LIST: Past Medical History:  Diagnosis Date  . Depression   . Diet-controlled type 2 diabetes mellitus (CMS/HHS-HCC) 05/19/2021  . Diverticulitis   . Hypertension   . Obesity   . Reflux esophagitis     Past Surgical History:  Procedure Laterality Date  . 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 Left knee manipulation Left 12/14/13  . COLONOSCOPY  02/22/2015   Entire examined colon is normal/FHx of colon cancer-Father/Repeat 12yrs/PYO  . APPENDECTOMY    . CESAREAN SECTION    . CHOLECYSTECTOMY    . ENDOSCOPIC CARPAL TUNNEL RELEASE    . HYSTERECTOMY      ALLERGIES: Allergies  Allergen Reactions  . Adhesive Tape-Silicones Other (See Comments)  . Aspirin Other (See Comments) and Swelling    unspecified eyes  . Codeine Swelling    Face will swell  . Norco [Hydrocodone-Acetaminophen ] Itching  . Percocet [Oxycodone -Acetaminophen ] Hives  . Wound Dressings Other (See Comments)    Other reaction(s): Other (See Comments) Band-aids ok for short-term  . Adhesive Other (See Comments) and Rash    blisters  . Sulfa (Sulfonamide Antibiotics) Rash    blisters    CURRENT MEDICATIONS: Current Outpatient Medications  Medication Sig Dispense Refill  . acetaminophen  (TYLENOL ) 650 MG ER tablet Take 650 mg by mouth every 8 (eight) hours as needed for pain.    . ALPRAZolam  (XANAX ) 0.25 MG tablet TAKE 1 TABLET ONE TIME DAILY AS NEEDED FOR ANXIETY 30 tablet 0  . cyanocobalamin (VITAMIN B12) 1,000 mcg/mL injection Inject 1 mL (1,000 mcg total) into the muscle monthly 10 mL 2  .  hydroCHLOROthiazide  (HYDRODIURIL ) 25 MG tablet TAKE 1 TABLET EVERY DAY 90 tablet 3  . linaCLOtide  (LINZESS ) 290 mcg capsule Take 1 capsule (290 mcg total) by mouth once daily 90 capsule 3  . naproxen (NAPROSYN) 500 MG tablet TAKE 1 TABLET BY MOUTH TWICE DAILY AS NEEDED FOR HEADACHE 60 tablet 0  . olmesartan -hydroCHLOROthiazide  (BENICAR  HCT) 40-12.5 mg tablet TAKE 1 TABLET EVERY DAY 90 tablet 3  . ondansetron  (ZOFRAN ) 4 MG tablet Take 1 tablet (4 mg total) by mouth every 8 (eight) hours as needed for Nausea 20 tablet 2  . pantoprazole  (PROTONIX ) 40 MG DR tablet Take 1 tablet (40 mg total) by mouth once daily 100 tablet 3  . phentermine (ADIPEX-P) 30 MG capsule Take 1 capsule (30 mg total) by mouth every other day for 180 days 15 capsule 3  . albuterol 90 mcg/actuation inhaler Inhale 2 inhalations into the lungs every 6 (six) hours as needed for Wheezing 1 each 0  . amLODIPine (NORVASC) 5 MG tablet Take 1 tablet (5 mg total) by mouth once daily 90 tablet 3  . amoxicillin -clavulanate (AUGMENTIN ) 875-125 mg tablet Take 1 tablet (875 mg total) by mouth every 12 (twelve) hours for 7 days 14 tablet 0  . bisacodyL (DULCOLAX) 10 mg suppository Place 1 suppository (10 mg total) rectally once daily as  needed for Constipation for up to 10 days 12 suppository 0  . sucralfate (CARAFATE) 1 gram tablet Take 1 tablet (1 g total) by mouth 2 (two) times daily before meals (Patient not taking: Reported on 11/11/2023) 60 tablet 1   No current facility-administered medications for this visit.      HPI   CLINICAL SUMMARY:  Patient has been having significant lateral left hip pain, cannot lay on that side.  Really no trouble walking.  Blood pressure has been more stable with 5 mg Norvasc.  She is had more abdominal bloating and bowels been a bit more constipated.  She is concerned she has diverticulitis.  ROS: Review of systems is unremarkable for any active cardiac, respiratory, GI, GU, hematologic, neurologic,  dermatologic, HEENT, or psychiatric symptoms except as noted above, 10 systems reviewed.  No fevers, chills, or constitutional symptoms.   PHYSICAL EXAM  Vital signs:  BP (!) 140/80   Pulse 76   Wt 91.9 kg (202 lb 9.6 oz)   SpO2 94%   BMI 38.28 kg/m  Body mass index is 38.28 kg/m.   Wt Readings from Last 3 Encounters:  11/11/23 91.9 kg (202 lb 9.6 oz)  07/08/23 90.3 kg (199 lb)  07/05/23 91.2 kg (201 lb)     BP Readings from Last 3 Encounters:  11/11/23 (!) 140/80  07/14/23 (!) 140/80  07/08/23 (!) 150/68    Constitutional:NAD Neck: supple, no thyromegaly, good ROM Respiratory:clear to auscultation, no rales or wheezes Cardiovascular:RRR, no murmur or gallop Abdominal:soft, good BS, mild distention with mild to moderate left lower quadrant tenderness Ext: no edema, good peripheral pulses, significant left trochanteric tenderness Neuro: alert and oriented X 3, grossly nonfocal     ASSESSMENT/PLAN   Hypertension-stable on Norvasc 5 mg daily Abdominal bloating-concerning for low-grade diverticulitis and constipation.  Will treat with Augmentin  875 mg twice daily with Dulcolax suppository to get her bowels moving.  She will let me know how she does, nonacute abdomen Left hip pain-really not her hip, no trouble issues there but it is bursitis, injection given and hopefully that will resolve her symptoms Chronic constipation-Linzess  daily Lumbar disc disease-doing fairly well  Dispo:   Return in about 6 months (around 05/13/2024) for physical.

## 2023-11-11 NOTE — Procedures (Signed)
 After time out occurred and verbal informed consent obtained, 1.5cc Marcaine and 2cc Kenalog were injected under sterile conditions into the left  greater trochanter bursa  without complication.

## 2023-12-19 ENCOUNTER — Inpatient Hospital Stay
Admission: EM | Admit: 2023-12-19 | Discharge: 2023-12-23 | DRG: 392 | Disposition: A | Discharge status: Home / Self Care | Attending: Internal Medicine | Admitting: Internal Medicine

## 2023-12-19 ENCOUNTER — Emergency Department

## 2023-12-19 DIAGNOSIS — Z8249 Family history of ischemic heart disease and other diseases of the circulatory system: Secondary | ICD-10-CM

## 2023-12-19 DIAGNOSIS — Z833 Family history of diabetes mellitus: Secondary | ICD-10-CM | POA: Diagnosis not present

## 2023-12-19 DIAGNOSIS — R52 Pain, unspecified: Secondary | ICD-10-CM

## 2023-12-19 DIAGNOSIS — Z91048 Other nonmedicinal substance allergy status: Secondary | ICD-10-CM

## 2023-12-19 DIAGNOSIS — M1990 Unspecified osteoarthritis, unspecified site: Secondary | ICD-10-CM | POA: Diagnosis present

## 2023-12-19 DIAGNOSIS — Z9109 Other allergy status, other than to drugs and biological substances: Secondary | ICD-10-CM

## 2023-12-19 DIAGNOSIS — Z79899 Other long term (current) drug therapy: Secondary | ICD-10-CM | POA: Diagnosis not present

## 2023-12-19 DIAGNOSIS — Z90710 Acquired absence of both cervix and uterus: Secondary | ICD-10-CM | POA: Diagnosis not present

## 2023-12-19 DIAGNOSIS — E785 Hyperlipidemia, unspecified: Secondary | ICD-10-CM | POA: Diagnosis present

## 2023-12-19 DIAGNOSIS — F330 Major depressive disorder, recurrent, mild: Secondary | ICD-10-CM | POA: Diagnosis present

## 2023-12-19 DIAGNOSIS — F411 Generalized anxiety disorder: Secondary | ICD-10-CM | POA: Diagnosis present

## 2023-12-19 DIAGNOSIS — Z882 Allergy status to sulfonamides status: Secondary | ICD-10-CM

## 2023-12-19 DIAGNOSIS — E66812 Obesity, class 2: Secondary | ICD-10-CM | POA: Diagnosis present

## 2023-12-19 DIAGNOSIS — K5732 Diverticulitis of large intestine without perforation or abscess without bleeding: Principal | ICD-10-CM | POA: Diagnosis present

## 2023-12-19 DIAGNOSIS — F419 Anxiety disorder, unspecified: Secondary | ICD-10-CM

## 2023-12-19 DIAGNOSIS — Z885 Allergy status to narcotic agent status: Secondary | ICD-10-CM

## 2023-12-19 DIAGNOSIS — K5792 Diverticulitis of intestine, part unspecified, without perforation or abscess without bleeding: Principal | ICD-10-CM | POA: Diagnosis present

## 2023-12-19 DIAGNOSIS — K5730 Diverticulosis of large intestine without perforation or abscess without bleeding: Secondary | ICD-10-CM | POA: Diagnosis not present

## 2023-12-19 DIAGNOSIS — Z886 Allergy status to analgesic agent status: Secondary | ICD-10-CM | POA: Diagnosis not present

## 2023-12-19 DIAGNOSIS — Z791 Long term (current) use of non-steroidal anti-inflammatories (NSAID): Secondary | ICD-10-CM

## 2023-12-19 DIAGNOSIS — K5909 Other constipation: Secondary | ICD-10-CM | POA: Diagnosis present

## 2023-12-19 DIAGNOSIS — K529 Noninfective gastroenteritis and colitis, unspecified: Secondary | ICD-10-CM | POA: Diagnosis present

## 2023-12-19 DIAGNOSIS — Z6834 Body mass index (BMI) 34.0-34.9, adult: Secondary | ICD-10-CM | POA: Diagnosis not present

## 2023-12-19 DIAGNOSIS — E119 Type 2 diabetes mellitus without complications: Secondary | ICD-10-CM | POA: Diagnosis present

## 2023-12-19 DIAGNOSIS — Z7985 Long-term (current) use of injectable non-insulin antidiabetic drugs: Secondary | ICD-10-CM | POA: Diagnosis not present

## 2023-12-19 DIAGNOSIS — K439 Ventral hernia without obstruction or gangrene: Secondary | ICD-10-CM | POA: Diagnosis not present

## 2023-12-19 DIAGNOSIS — I10 Essential (primary) hypertension: Secondary | ICD-10-CM | POA: Diagnosis present

## 2023-12-19 DIAGNOSIS — R1032 Left lower quadrant pain: Secondary | ICD-10-CM | POA: Diagnosis present

## 2023-12-19 DIAGNOSIS — K219 Gastro-esophageal reflux disease without esophagitis: Secondary | ICD-10-CM | POA: Diagnosis present

## 2023-12-19 LAB — COMPREHENSIVE METABOLIC PANEL WITH GFR
ALT: 19 U/L (ref 0–44)
AST: 19 U/L (ref 15–41)
Albumin: 3.7 g/dL (ref 3.5–5.0)
Alkaline Phosphatase: 77 U/L (ref 38–126)
Anion gap: 10 (ref 5–15)
BUN: 32 mg/dL — ABNORMAL HIGH (ref 8–23)
CO2: 25 mmol/L (ref 22–32)
Calcium: 9.2 mg/dL (ref 8.9–10.3)
Chloride: 104 mmol/L (ref 98–111)
Creatinine, Ser: 1.19 mg/dL — ABNORMAL HIGH (ref 0.44–1.00)
GFR, Estimated: 48 mL/min — ABNORMAL LOW (ref >60)
Glucose, Bld: 103 mg/dL — ABNORMAL HIGH (ref 70–99)
Potassium: 4.3 mmol/L (ref 3.5–5.1)
Sodium: 139 mmol/L (ref 135–145)
Total Bilirubin: 0.8 mg/dL (ref 0.0–1.2)
Total Protein: 6.6 g/dL (ref 6.5–8.1)

## 2023-12-19 LAB — URINALYSIS, ROUTINE W REFLEX MICROSCOPIC
Bilirubin Urine: NEGATIVE
Glucose, UA: NEGATIVE mg/dL
Hgb urine dipstick: NEGATIVE
Ketones, ur: NEGATIVE mg/dL
Leukocytes,Ua: NEGATIVE
Nitrite: NEGATIVE
Protein, ur: NEGATIVE mg/dL
Specific Gravity, Urine: 1.015 (ref 1.005–1.030)
pH: 5 (ref 5.0–8.0)

## 2023-12-19 LAB — CBC
HCT: 35.9 % — ABNORMAL LOW (ref 36.0–46.0)
Hemoglobin: 11.4 g/dL — ABNORMAL LOW (ref 12.0–15.0)
MCH: 30.6 pg (ref 26.0–34.0)
MCHC: 31.8 g/dL (ref 30.0–36.0)
MCV: 96.5 fL (ref 80.0–100.0)
Platelets: 218 K/uL (ref 150–400)
RBC: 3.72 MIL/uL — ABNORMAL LOW (ref 3.87–5.11)
RDW: 13.9 % (ref 11.5–15.5)
WBC: 7.4 K/uL (ref 4.0–10.5)
nRBC: 0 % (ref 0.0–0.2)

## 2023-12-19 LAB — LIPASE, BLOOD: Lipase: 29 U/L (ref 11–51)

## 2023-12-19 MED ORDER — SODIUM CHLORIDE 0.9 % IV SOLN: INTRAVENOUS | Status: AC

## 2023-12-19 MED ORDER — OLMESARTAN MEDOXOMIL-HCTZ 20-12.5 MG PO TABS: 1.0000 | ORAL_TABLET | Freq: Every day | ORAL | Status: DC

## 2023-12-19 MED ORDER — ACETAMINOPHEN 325 MG PO TABS
650.0000 mg | ORAL_TABLET | Freq: Four times a day (QID) | ORAL | Status: DC | PRN
Start: 2023-12-19 — End: 2023-12-24
  Administered 2023-12-20 – 2023-12-21 (×2): 650 mg via ORAL
  Filled 2023-12-19 (×2): qty 2

## 2023-12-19 MED ORDER — IOHEXOL 300 MG/ML  SOLN
100.0000 mL | Freq: Once | INTRAMUSCULAR | Status: AC | PRN
  Administered 2023-12-19: 100 mL via INTRAVENOUS

## 2023-12-19 MED ORDER — PIPERACILLIN-TAZOBACTAM 3.375 G IVPB 30 MIN
3.3750 g | Freq: Once | INTRAVENOUS | Status: DC
  Filled 2023-12-19: qty 50

## 2023-12-19 MED ORDER — ALPRAZOLAM 0.25 MG PO TABS
0.2500 mg | ORAL_TABLET | Freq: Every day | ORAL | Status: DC | PRN
  Administered 2023-12-19 – 2023-12-21 (×3): 0.25 mg via ORAL
  Filled 2023-12-19 (×3): qty 1

## 2023-12-19 MED ORDER — FENTANYL CITRATE PF 50 MCG/ML IJ SOSY: 25.0000 ug | PREFILLED_SYRINGE | INTRAMUSCULAR | Status: DC | PRN

## 2023-12-19 MED ORDER — SERTRALINE HCL 50 MG PO TABS: 50.0000 mg | ORAL_TABLET | Freq: Every day | ORAL | Status: DC

## 2023-12-19 MED ORDER — ACETAMINOPHEN 650 MG RE SUPP: 650.0000 mg | Freq: Four times a day (QID) | RECTAL | Status: DC | PRN

## 2023-12-19 MED ORDER — HYDROCHLOROTHIAZIDE 12.5 MG PO TABS
12.5000 mg | ORAL_TABLET | Freq: Every day | ORAL | Status: DC
  Administered 2023-12-20 – 2023-12-21 (×2): 12.5 mg via ORAL
  Filled 2023-12-19 (×2): qty 1

## 2023-12-19 MED ORDER — ONDANSETRON HCL 4 MG/2ML IJ SOLN
4.0000 mg | Freq: Four times a day (QID) | INTRAMUSCULAR | Status: DC | PRN
  Administered 2023-12-20: 4 mg via INTRAVENOUS
  Filled 2023-12-19: qty 2

## 2023-12-19 MED ORDER — ONDANSETRON HCL 4 MG/2ML IJ SOLN
4.0000 mg | Freq: Once | INTRAMUSCULAR | Status: AC
  Administered 2023-12-19: 4 mg via INTRAVENOUS
  Filled 2023-12-19: qty 2

## 2023-12-19 MED ORDER — HYDRALAZINE HCL 20 MG/ML IJ SOLN
5.0000 mg | Freq: Four times a day (QID) | INTRAMUSCULAR | Status: DC | PRN
  Administered 2023-12-22: 5 mg via INTRAVENOUS
  Filled 2023-12-19: qty 1

## 2023-12-19 MED ORDER — PIPERACILLIN-TAZOBACTAM 3.375 G IVPB
3.3750 g | Freq: Three times a day (TID) | INTRAVENOUS | Status: DC
  Administered 2023-12-19 – 2023-12-23 (×12): 3.375 g via INTRAVENOUS
  Filled 2023-12-19 (×11): qty 50

## 2023-12-19 MED ORDER — IRBESARTAN 150 MG PO TABS
150.0000 mg | ORAL_TABLET | Freq: Every day | ORAL | Status: DC
  Administered 2023-12-20 – 2023-12-21 (×2): 150 mg via ORAL
  Filled 2023-12-19 (×2): qty 1

## 2023-12-19 MED ORDER — SODIUM CHLORIDE 0.9 % IV BOLUS
500.0000 mL | Freq: Once | INTRAVENOUS | Status: AC
  Administered 2023-12-19: 500 mL via INTRAVENOUS

## 2023-12-19 MED ORDER — MORPHINE SULFATE (PF) 4 MG/ML IV SOLN
4.0000 mg | Freq: Once | INTRAVENOUS | Status: AC
  Administered 2023-12-19: 4 mg via INTRAVENOUS
  Filled 2023-12-19: qty 1

## 2023-12-19 MED ORDER — ONDANSETRON HCL 4 MG PO TABS: 4.0000 mg | ORAL_TABLET | Freq: Four times a day (QID) | ORAL | Status: DC | PRN

## 2023-12-19 MED ORDER — HEPARIN SODIUM (PORCINE) 5000 UNIT/ML IJ SOLN
5000.0000 [IU] | Freq: Three times a day (TID) | INTRAMUSCULAR | Status: DC
  Administered 2023-12-19 – 2023-12-23 (×11): 5000 [IU] via SUBCUTANEOUS
  Filled 2023-12-19 (×11): qty 1

## 2023-12-19 MED ORDER — PIPERACILLIN-TAZOBACTAM 3.375 G IVPB: 3.3750 g | Freq: Three times a day (TID) | INTRAVENOUS | Status: DC

## 2023-12-19 MED ORDER — MORPHINE SULFATE (PF) 2 MG/ML IV SOLN
2.0000 mg | INTRAVENOUS | Status: AC | PRN
  Administered 2023-12-19 – 2023-12-20 (×5): 2 mg via INTRAVENOUS
  Filled 2023-12-19 (×5): qty 1

## 2023-12-19 MED ORDER — FAMOTIDINE 20 MG PO TABS
20.0000 mg | ORAL_TABLET | Freq: Two times a day (BID) | ORAL | Status: DC
  Administered 2023-12-19 – 2023-12-23 (×8): 20 mg via ORAL
  Filled 2023-12-19 (×8): qty 1

## 2023-12-19 NOTE — Assessment & Plan Note (Signed)
 Home olmesartan -hydrochlorothiazide  20-12.5 mg p.o. daily resumed for 9/8

## 2023-12-19 NOTE — ED Notes (Signed)
 UA cup at bedside. Patient instructed to call RN when need to urinate arises.

## 2023-12-19 NOTE — Assessment & Plan Note (Signed)
 Hydralazine  5 mg IV every 5 hours as needed for SBP > 170, 5 days ordered

## 2023-12-19 NOTE — Assessment & Plan Note (Signed)
 Continue with Zosyn  per pharmacy Sodium chloride  infusion at 125 mL/h, 1 day ordered

## 2023-12-19 NOTE — Assessment & Plan Note (Addendum)
 PDMP reviewed Current prescription includes alprazolam  0.25 mg tablet, 30-day supply, filled on 10/30/2023 Home alprazolam  0.25 mg daily as needed for anxiety resumed on admission

## 2023-12-19 NOTE — Assessment & Plan Note (Signed)
 Patient states pain is temporarily relieved with morphine  Suspect secondary to diverticulitis Morphine  2 mg IV every 4 hours as needed for moderate pain, 20 hours of coverage ordered; fentanyl  25 mcg IV every 4 hours as needed for severe pain, 20 hours of coverage ordered Goal of pain control is to take the pain down 50% not to make it 0

## 2023-12-19 NOTE — Hospital Course (Signed)
 Ms. [name] is a 73 year old female with history of depression, anxiety, GERD, non-insulin-dependent diabetes mellitus, insomnia, hypertension, presents emergency department for chief concerns of abdominal pain.  Vitals in the ED showed temperature of 98.4, respiration 18, heart rate 73, blood pressure 161/73, SpO2 100% on room air.  Serum sodium is 139, potassium 4.3, chloride 104, bicarb 25, BUN of 32, serum creatinine 1.19, eGFR 48, nonfasting blood glucose 103, WBC 7.4, hemoglobin 11.4, platelets of 218.  UA was negative for leukocytes and nitrates.  ED treatment: Morphine  4 mg IV 2 doses were given, ondansetron  4 mg IV one-time dose, Zosyn  per pharmacy, sodium chloride  500 mL liter bolus.

## 2023-12-19 NOTE — ED Notes (Signed)
 Advised nurse that patient has ready bed

## 2023-12-19 NOTE — ED Triage Notes (Addendum)
 Pt to ED via POV from home. Pt reports lower abd pain x1 wk. Pt reports pain got worse yesterday and had some bowel leakage yesterday. Some nausea. Hx of diverticulitis.

## 2023-12-19 NOTE — ED Provider Notes (Signed)
 [facility] Provider Note    Event Date/Time   First MD Initiated Contact with Patient 12/19/23 [phone]     (approximate)   History   Abdominal Pain   HPI  [name] is a 73 y.o. female with a history of hypertension, type 2 diabetes, obesity, and depression who presents with abdominal pain since yesterday, along the left side of her abdomen, epigastric and into the left lower quadrant.  She reports associated nausea but no vomiting.  She states that she has had some leakage of stool when she urinates and has rectal pain but denies any diarrhea.  She has overall been somewhat constipated recently.  She denies any acute urinary symptoms.  She states that she has had some abdominal pain and constipation for a while.  I reviewed the past medical records.  The patient's most recent outpatient encounter was with internal medicine on 7/31 for evaluation of abdominal pain.  She was thought to have possible low-grade diverticulitis and constipation and was started on Augmentin  at that time.   Physical Exam   Triage Vital Signs: ED Triage Vitals  Encounter Vitals Group     BP 12/19/23 0944 (!) 161/73     Girls Systolic BP Percentile --      Girls Diastolic BP Percentile --      Boys Systolic BP Percentile --      Boys Diastolic BP Percentile --      Pulse Rate 12/19/23 0943 73     Resp 12/19/23 0943 18     Temp 12/19/23 0943 98.4 F (36.9 C)     Temp Source 12/19/23 0943 Oral     SpO2 12/19/23 0943 100 %     Weight --      Height --      Head Circumference --      Peak Flow --      Pain Score 12/19/23 0944 10     Pain Loc --      Pain Education --      Exclude from Growth Chart --     Most recent vital signs: Vitals:   12/19/23 1451 12/19/23 1530  BP:  (!) 111/33  Pulse:  68  Resp:  (!) 21  Temp: 98.3 F (36.8 C)   SpO2:  93%    General: Awake, comfortable appearing, no distress.  CV:  Good peripheral perfusion.  Resp:  Normal effort.   Abd:  Epigastric and left lower quadrant tenderness.  No distention.  Other:  No jaundice or scleral icterus.   ED Results / Procedures / Treatments   Labs (all labs ordered are listed, but only abnormal results are displayed) Labs Reviewed  COMPREHENSIVE METABOLIC PANEL WITH GFR - Abnormal; Notable for the following components:      Result Value   Glucose, Bld 103 (*)    BUN 32 (*)    Creatinine, Ser 1.19 (*)    GFR, Estimated 48 (*)    All other components within normal limits  CBC - Abnormal; Notable for the following components:   RBC 3.72 (*)    Hemoglobin 11.4 (*)    HCT 35.9 (*)    All other components within normal limits  URINALYSIS, ROUTINE W REFLEX MICROSCOPIC - Abnormal; Notable for the following components:   Color, Urine STRAW (*)    APPearance CLEAR (*)    All other components within normal limits  LIPASE, BLOOD     EKG     RADIOLOGY  CT abdomen/pelvis: I independently viewed and interpreted the images;  there are no dilated bowel loops or any free air or free fluid.  Radiology report indicates the following:  IMPRESSION:  1. Extensive colonic diverticulosis with suspicion of mild proximal sigmoid  diverticulitis.  2. No other explanation for patient's symptoms.  3. Aortic atherosclerosis ([id number]-i70.0)    PROCEDURES:  Critical Care performed: No  Procedures   MEDICATIONS ORDERED IN ED: Medications  acetaminophen  (TYLENOL ) tablet 650 mg (has no administration in time range)    Or  acetaminophen  (TYLENOL ) suppository 650 mg (has no administration in time range)  ondansetron  (ZOFRAN ) tablet 4 mg (has no administration in time range)    Or  ondansetron  (ZOFRAN ) injection 4 mg (has no administration in time range)  heparin  injection 5,000 Units (has no administration in time range)  0.9 %  sodium chloride  infusion ( Intravenous New Bag/Given 12/19/23 1451)  morphine  (PF) 2 MG/ML injection 2 mg (has no administration in time range)  fentaNYL   (SUBLIMAZE ) injection 25 mcg (has no administration in time range)  hydrALAZINE  (APRESOLINE ) injection 5 mg (has no administration in time range)  piperacillin -tazobactam (ZOSYN ) IVPB 3.375 g (3.375 g Intravenous New Bag/Given 12/19/23 1451)  ALPRAZolam  (XANAX ) tablet 0.25 mg (has no administration in time range)  sertraline  (ZOLOFT ) tablet 50 mg (has no administration in time range)  famotidine  (PEPCID ) tablet 20 mg (has no administration in time range)  irbesartan  (AVAPRO ) tablet 150 mg (has no administration in time range)    And  hydrochlorothiazide  (HYDRODIURIL ) tablet 12.5 mg (has no administration in time range)  ondansetron  (ZOFRAN ) injection 4 mg (4 mg Intravenous Given 12/19/23 1115)  morphine  (PF) 4 MG/ML injection 4 mg (4 mg Intravenous Given 12/19/23 1115)  sodium chloride  0.9 % bolus 500 mL (0 mLs Intravenous Stopped 12/19/23 1235)  iohexol  (OMNIPAQUE ) 300 MG/ML solution 100 mL (100 mLs Intravenous Contrast Given 12/19/23 1141)  morphine  (PF) 4 MG/ML injection 4 mg (4 mg Intravenous Given 12/19/23 1335)     IMPRESSION / MDM / ASSESSMENT AND PLAN / ED COURSE  I reviewed the triage vital signs and the nursing notes.  73 year old female with PMH as noted above presents with epigastric and left lower quadrant abdominal pain since yesterday associated with nausea.  On exam the patient is uncomfortable appearing.  She is hypertensive with otherwise normal vital signs.  Abdomen is tender in the epigastric and left lower quadrants.  Differential diagnosis includes, but is not limited to, diverticulitis, colitis, gastroenteritis, gastritis, gastroparesis, SBO, volvulus, pancreatitis, other hepatobiliary cause.  CBC shows no leukocytosis.  CMP shows no acute findings.  Lipase is normal.  We will obtain a CT for further evaluation.  Patient's presentation is most consistent with acute complicated illness / injury requiring diagnostic workup.  ----------------------------------------- 2:07 PM on  12/19/2023 -----------------------------------------  CT shows sigmoid diverticulitis.  CBC and CMP are unremarkable.  The patient is having fairly refractory pain and nausea.  Therefore, she will benefit from admission for further treatment and hydration.  I ordered IV Zosyn .  Based on her report of proctalgia I also did an external rectal exam which showed no large external hemorrhoids, fissures, or significant tenderness.  I consulted Dr. Sherre from the hospitalist service; based on our discussion she agrees to evaluate patient for admission.  FINAL CLINICAL IMPRESSION(S) / ED DIAGNOSES   Final diagnoses:  Diverticulitis     Rx / DC Orders   ED Discharge Orders     None  Note:  This document was prepared using Dragon voice recognition software and may include unintentional dictation errors.    Jacolyn Pae, MD 12/19/23 [phone]

## 2023-12-19 NOTE — H&P (Addendum)
 History and Physical   [name] FMW:[id number] DOB: February 28, 1951 DOA: 12/19/2023  PCP: Cleotilde Oneil FALCON, MD  Patient coming from: Home  I have personally briefly reviewed patient's old medical records in [facility] EMR.  Chief Concern: Abdominal pain  HPI: [name] is a 73 year old female with history of depression, anxiety, GERD, non-insulin-dependent diabetes mellitus, insomnia, hypertension, presents emergency department for chief concerns of abdominal pain.  Vitals in the ED showed temperature of 98.4, respiration 18, heart rate 73, blood pressure 161/73, SpO2 100% on room air.  Serum sodium is 139, potassium 4.3, chloride 104, bicarb 25, BUN of 32, serum creatinine 1.19, eGFR 48, nonfasting blood glucose 103, WBC 7.4, hemoglobin 11.4, platelets of 218.  UA was negative for leukocytes and nitrates.  ED treatment: Morphine  4 mg IV 2 doses were given, ondansetron  4 mg IV one-time dose, Zosyn  per pharmacy, sodium chloride  500 mL liter bolus. ------------------------------------------------- At bedside, patient able to tell me her first and last name, age, location, current calendar year.  She reports that she started having abdominal pain for about 2 weeks.  She reports that on Friday and Saturday, the pain became so severe that she is presenting now because she cannot stand it anymore.  She denies trauma to her person.  She reports the pain is similar to prior episodes of diverticulitis.  She denies fever, vomiting, chest pain, dysuria, hematuria, diarrhea, blood in her stool.  She endorses chills, nausea, and shortness of breath associated with the pain.  She reports that she does have runny stool sometimes.She endorses increased gas.  Social history: She lives at home on her own.  She denies tobacco, recreational drug use.  She infrequently consumes EtOH, her last drink was Wednesday and it was 1 beer.  She is retired.  ROS: Constitutional: no weight change, no fever, +  chills ENT/Mouth: no sore throat, no rhinorrhea Eyes: no eye pain, no vision changes Cardiovascular: no chest pain, no dyspnea,  no edema, no palpitations Respiratory: no cough, no sputum, no wheezing Gastrointestinal: + nausea, no vomiting, no diarrhea, no constipation, + left lower quadrant abdominal pain Genitourinary: no urinary incontinence, no dysuria, no hematuria Musculoskeletal: no arthralgias, no myalgias Skin: no skin lesions, no pruritus, Neuro: no weakness, no loss of consciousness, no syncope Psych: no anxiety, no depression, + decrease appetite Heme/Lymph: no bruising, no bleeding  ED Course: Discussed with EDP, patient requiring hospitalization for chief concerns of diverticulitis and pain control.  Assessment/Plan  Principal Problem:   Diverticulitis Active Problems:   Inadequate pain control   Anxiety   Gastroesophageal reflux disease   Hyperlipidemia   Major depressive disorder, recurrent, mild (HCC)   Benign essential hypertension   Hypertension   Assessment and Plan:  * Diverticulitis Continue with Zosyn  per pharmacy Sodium chloride  infusion at 125 mL/h, 1 day ordered  Inadequate pain control Patient states pain is temporarily relieved with morphine  Suspect secondary to diverticulitis Morphine  2 mg IV every 4 hours as needed for moderate pain, 20 hours of coverage ordered; fentanyl  25 mcg IV every 4 hours as needed for severe pain, 20 hours of coverage ordered Goal of pain control is to take the pain down 50% not to make it 0  Anxiety PDMP reviewed Current prescription includes alprazolam  0.25 mg tablet, 30-day supply, filled on 10/30/2023 Home alprazolam  0.25 mg daily as needed for anxiety resumed on admission  Gastroesophageal reflux disease Home famotidine  20 mg p.o. twice daily resumed  Hypertension Hydralazine  5 mg IV every  5 hours as needed for SBP > 170, 5 days ordered  Benign essential hypertension Home olmesartan -hydrochlorothiazide   20-12.5 mg p.o. daily resumed for 9/8  Chart reviewed.   DVT prophylaxis: Heparin  5000 units subcutaneous every 8 hours Code Status: Full code Diet: Heart healthy/carb modified Family Communication: Updated son, DJ and niece, Delon at bedside with patient's permission Disposition Plan: Pending clinical course Consults called: Pharmacy Admission status: Telemetry medical, observation  Past Medical History:  Diagnosis Date   Arthritis    Family history of adverse reaction to anesthesia    Sister - PONV   GERD (gastroesophageal reflux disease)    Hypertension    Migraine headache    had one 07/13/19 after approx 40 years without one   PONV (postoperative nausea and vomiting)    Wears dentures    Full upper   Past Surgical History:  Procedure Laterality Date   ABDOMINAL HYSTERECTOMY     CATARACT EXTRACTION W/PHACO Right 09/06/2019   Procedure: CATARACT EXTRACTION PHACO AND INTRAOCULAR LENS PLACEMENT (IOC) RIGHT 5.38  00:34.8;  Surgeon: Ferol Rogue, MD;  Location: [facility];  Service: Ophthalmology;  Laterality: Right;   CATARACT EXTRACTION W/PHACO Left 09/28/2019   Procedure: CATARACT EXTRACTION PHACO AND INTRAOCULAR LENS PLACEMENT (IOC) LEFT 9.31  00:57.5;  Surgeon: Ferol Rogue, MD;  Location: [facility];  Service: Ophthalmology;  Laterality: Left;   CESAREAN SECTION     COLONOSCOPY N/A 02/22/2015   Procedure: COLONOSCOPY;  Surgeon: Deward CINDERELLA Piedmont, MD;  Location: [facility] END[facility];  Service: Gastroenterology;  Laterality: N/A;   COLONOSCOPY WITH PROPOFOL  N/A 10/03/2020   Procedure: COLONOSCOPY WITH PROPOFOL ;  Surgeon: Therisa Bi, MD;  Location: [facility] ENDOSCOPY;  Service: Gastroenterology;  Laterality: N/A;   ESOPHAGOGASTRODUODENOSCOPY (EGD) WITH PROPOFOL  N/A 10/03/2020   Procedure: ESOPHAGOGASTRODUODENOSCOPY (EGD) WITH PROPOFOL ;  Surgeon: Therisa Bi, MD;  Location: [facility] END[facility]COPY;  Service: Gastroenterology;  Laterality: N/A;   EYE SURGERY     KNEE ARTHROSCOPY AND  ARTHROTOMY     WRIST SURGERY     Social History:  reports that she has never smoked. She has never used smokeless tobacco. She reports current alcohol use of about 1.0 standard drink of alcohol per week. She reports that she does not use drugs.  Allergies  Allergen Reactions   Aspirin Swelling    eyes  Other Reaction(s): makes eyes swell and puff up and gets welts., Not available  aluminum aspirin   Silicone Other (See Comments)   Codeine Swelling    Other Reaction(s): causes hallucinations   Allevyn Adhesive [Wound Dressings] Other (See Comments)    Other reaction(s): Other (See Comments) Band-aids ok for short-term    Norco [Hydrocodone-Acetaminophen ] Itching   Percocet [Oxycodone -Acetaminophen ] Hives   Adhesive [Tape] Rash    blisters   Sulfa Antibiotics Rash    blisters blisters   Wound Dressing Adhesive Rash    Other reaction(s): Other (See Comments) blisters   Family History  Problem Relation Age of Onset   Heart attack Mother    Colon cancer Father    Diabetes Sister    Breast cancer Neg Hx    Family history: Family history reviewed and not pertinent.  Prior to Admission medications   Medication Sig Start Date End Date Taking? Authorizing Provider  acetaminophen  (TYLENOL ) 650 MG CR tablet Take 650 mg by mouth every 8 (eight) hours as needed for pain.    [provider]  ALPRAZolam  (XANAX ) 0.25 MG tablet Take 1 tablet by mouth daily as needed. 07/03/21   [provider]  amitriptyline (ELAVIL) 25 MG tablet Take 1 tablet daily by mouth. 04/25/14   [provider]  celecoxib (CELEBREX) 200 MG capsule Take 200 mg by mouth 2 (two) times daily. 08/25/21   [provider]  Cholecalciferol (VITAMIN D3) 10 MCG (400 UNIT) tablet Take 1 tablet by mouth daily. 11/17/19   [provider]  dicyclomine  (BENTYL ) 10 MG capsule Take 1 capsule (10 mg total) by mouth 3 (three) times daily before meals. 09/10/21 12/09/21  Therisa Bi, MD   famotidine  (PEPCID ) 20 MG tablet Take 1 tablet (20 mg total) by mouth 2 (two) times daily. 09/09/21 09/09/22  Fisher, Devere ORN, PA-C  fluconazole (DIFLUCAN) 150 MG tablet Take 150 mg by mouth once. 08/25/21   [provider]  LINZESS  290 MCG CAPS capsule TAKE 1 CAPSULE EVERY DAY BEFORE BREAKFAST 08/20/22   Therisa Bi, MD  metoCLOPramide  (REGLAN ) 10 MG tablet Take 1 tablet (10 mg total) by mouth 3 (three) times daily with meals for 10 days. 04/10/23 04/20/23  Menshew, Candida LULLA Kings, PA-C  olmesartan -hydrochlorothiazide  (BENICAR  HCT) 20-12.5 MG tablet Take 1 tablet by mouth daily. 05/08/21   [provider]  omeprazole  (PRILOSEC) 40 MG capsule Take 1 capsule (40 mg total) by mouth 2 (two) times daily. 09/10/21   Therisa Bi, MD  ondansetron  (ZOFRAN ) 4 MG tablet Take 1 tablet (4 mg total) by mouth 3 (three) times daily as needed for nausea or vomiting. 10/08/20   Therisa Bi, MD  ondansetron  (ZOFRAN -ODT) 4 MG disintegrating tablet Take 1 tablet (4 mg total) by mouth every 8 (eight) hours as needed. 09/09/21   Fisher, Devere ORN, PA-C  oxyCODONE  (OXY IR/ROXICODONE ) 5 MG immediate release tablet Take 1 tablet by mouth every 8 (eight) hours as needed. 08/15/21   [provider]  OZEMPIC, 0.25 OR 0.5 MG/DOSE, 2 MG/1.5ML SOPN Inject 0.25 mg into the skin once a week. 07/09/21   [provider]  polyethylene glycol (MIRALAX ) 17 g packet Take 17 g by mouth daily. 08/15/21   Clide Burnard Ee, MD  sertraline  (ZOLOFT ) 50 MG tablet Take 50 mg by mouth daily. 06/15/21   [provider]  traZODone (DESYREL) 50 MG tablet Take 1 tablet by mouth daily. 08/09/20   [provider]   Physical Exam: Vitals:   12/19/23 1451 12/19/23 1530 12/19/23 1620 12/19/23 1707  BP:  (!) 111/33 (!) 111/43 (!) 122/55  Pulse:  68 69   Resp:  (!) 21 20   Temp: 98.3 F (36.8 C)     TempSrc: Oral     SpO2:  93% 94%    Constitutional: appears age-appropriate, frail, mildly uncomfortable Eyes:  PERRL, lids and conjunctivae normal ENMT: Mucous membranes are moist. Posterior pharynx clear of any exudate or lesions. Age-appropriate dentition. Hearing appropriate Neck: normal, supple, no masses, no thyromegaly Respiratory: clear to auscultation bilaterally, no wheezing, no crackles. Normal respiratory effort. No accessory muscle use.  Cardiovascular: Regular rate and rhythm, no murmurs / rubs / gallops. No extremity edema. 2+ pedal pulses. No carotid bruits.  Abdomen: Obese abdomen, + LLQ tenderness, no masses palpated, no hepatosplenomegaly. Bowel sounds positive.  Musculoskeletal: no clubbing / cyanosis. No joint deformity upper and lower extremities. Good ROM, no contractures, no atrophy. Normal muscle tone.  Skin: no rashes, lesions, ulcers. No induration Neurologic: Sensation intact. Strength 5/5 in all 4.  Psychiatric: Normal judgment and insight. Alert and oriented x 3. Normal mood.   EKG: Not indicated at this Chest x-ray on Admission:  Not indicated at this time  CT ABDOMEN PELVIS W CONTRAST Result Date: 12/19/2023 EXAM: CT ABDOMEN AND PELVIS WITH CONTRAST 12/19/2023 11:48:46 AM TECHNIQUE: CT of the abdomen and pelvis was performed with the administration of intravenous contrast (100mL iohexol  (OMNIPAQUE ) 300 MG/ML solution). Multiplanar reformatted images are provided for review. Automated exposure control, iterative reconstruction, and/or weight-based adjustment of the mA/kV was utilized to reduce the radiation dose to as low as reasonably achievable. COMPARISON: 07/08/2023 CLINICAL HISTORY: LLQ abdominal pain. LLQ pain FINDINGS: LOWER CHEST: Moderate-to-marked  right hemidiaphragm elevation. LIVER: The liver is unremarkable. GALLBLADDER AND BILE DUCTS: Cholecystectomy. SPLEEN: No acute abnormality. PANCREAS: No acute abnormality. ADRENAL GLANDS: Bilateral adrenal thickening. KIDNEYS, URETERS AND BLADDER: No hydronephrosis. Right renal 1.0 cm low density lesion is likely a cyst. No  follow-up indicated. No perinephric or periureteral stranding. Urinary bladder is unremarkable. GI AND BOWEL: Normal stomach, without wall thickening. Scattered colonic diverticula. Suspect minimal edema adjacent to proximal sigmoid including on image 71/2. No drainable fluid collection or free perforation. Normal terminal ileum. Appendix not visualized, but no right lower quadrant inflammation seen. Normal small bowel. PERITONEUM AND RETROPERITONEUM: No ascites. No free air. VASCULATURE: Aortic atherosclerosis. LYMPH NODES: No lymphadenopathy. REPRODUCTIVE ORGANS: Hysterectomy. BONES AND SOFT TISSUES: No acute osseous abnormality. Small fat-containing ventral abdominal pelvic wall hernia. IMPRESSION: 1. Extensive colonic diverticulosis with suspicion of mild proximal sigmoid diverticulitis. 2. No other explanation for patient's symptoms. 3. Aortic atherosclerosis (icd10-i70.0) Electronically signed by: Rockey Kilts MD 12/19/2023 12:21 PM EDT RP Workstation: [id number]   Labs on Admission: I have personally reviewed following labs  CBC: Recent Labs  Lab 12/19/23 1008  WBC 7.4  HGB 11.4*  HCT 35.9*  MCV 96.5  PLT 218   Basic Metabolic Panel: Recent Labs  Lab 12/19/23 1008  NA 139  K 4.3  CL 104  CO2 25  GLUCOSE 103*  BUN 32*  CREATININE 1.19*  CALCIUM 9.2   GFR: CrCl cannot be calculated (Unknown ideal weight.).  Liver Function Tests: Recent Labs  Lab 12/19/23 1008  AST 19  ALT 19  ALKPHOS 77  BILITOT 0.8  PROT 6.6  ALBUMIN 3.7   Recent Labs  Lab 12/19/23 1008  LIPASE 29   Urine analysis:    Component Value Date/Time   COLORURINE STRAW (A) 12/19/2023 1204   APPEARANCEUR CLEAR (A) 12/19/2023 1204   APPEARANCEUR Clear 11/23/2013 0725   LABSPEC 1.015 12/19/2023 1204   LABSPEC 1.008 11/23/2013 0725   PHURINE 5.0 12/19/2023 1204   GLUCOSEU NEGATIVE 12/19/2023 1204   GLUCOSEU Negative 11/23/2013 0725   HGBUR NEGATIVE 12/19/2023 1204   BILIRUBINUR NEGATIVE 12/19/2023  1204   BILIRUBINUR Negative 11/23/2013 0725   KETONESUR NEGATIVE 12/19/2023 1204   PROTEINUR NEGATIVE 12/19/2023 1204   NITRITE NEGATIVE 12/19/2023 1204   LEUKOCYTESUR NEGATIVE 12/19/2023 1204   LEUKOCYTESUR Negative 11/23/2013 0725   This document was prepared using Dragon Voice Recognition software and may include unintentional dictation errors.  Dr. Sherre [facility]  If 7PM-7AM, please contact overnight-coverage provider If 7AM-7PM, please contact day attending provider [email or web address]  12/19/2023, 6:09 PM

## 2023-12-19 NOTE — Assessment & Plan Note (Deleted)
-   Home sertraline 50 mg daily

## 2023-12-19 NOTE — Assessment & Plan Note (Addendum)
 Home famotidine 20 mg p.o. twice daily resumed

## 2023-12-20 DIAGNOSIS — K219 Gastro-esophageal reflux disease without esophagitis: Secondary | ICD-10-CM | POA: Diagnosis present

## 2023-12-20 DIAGNOSIS — Z833 Family history of diabetes mellitus: Secondary | ICD-10-CM | POA: Diagnosis not present

## 2023-12-20 DIAGNOSIS — Z91048 Other nonmedicinal substance allergy status: Secondary | ICD-10-CM | POA: Diagnosis not present

## 2023-12-20 DIAGNOSIS — Z90710 Acquired absence of both cervix and uterus: Secondary | ICD-10-CM | POA: Diagnosis not present

## 2023-12-20 DIAGNOSIS — Z791 Long term (current) use of non-steroidal anti-inflammatories (NSAID): Secondary | ICD-10-CM | POA: Diagnosis not present

## 2023-12-20 DIAGNOSIS — Z6834 Body mass index (BMI) 34.0-34.9, adult: Secondary | ICD-10-CM | POA: Diagnosis not present

## 2023-12-20 DIAGNOSIS — I10 Essential (primary) hypertension: Secondary | ICD-10-CM | POA: Diagnosis present

## 2023-12-20 DIAGNOSIS — E119 Type 2 diabetes mellitus without complications: Secondary | ICD-10-CM | POA: Diagnosis present

## 2023-12-20 DIAGNOSIS — K529 Noninfective gastroenteritis and colitis, unspecified: Secondary | ICD-10-CM | POA: Diagnosis present

## 2023-12-20 DIAGNOSIS — Z886 Allergy status to analgesic agent status: Secondary | ICD-10-CM | POA: Diagnosis not present

## 2023-12-20 DIAGNOSIS — F411 Generalized anxiety disorder: Secondary | ICD-10-CM | POA: Diagnosis present

## 2023-12-20 DIAGNOSIS — R1032 Left lower quadrant pain: Secondary | ICD-10-CM | POA: Diagnosis present

## 2023-12-20 DIAGNOSIS — Z9109 Other allergy status, other than to drugs and biological substances: Secondary | ICD-10-CM | POA: Diagnosis not present

## 2023-12-20 DIAGNOSIS — Z8249 Family history of ischemic heart disease and other diseases of the circulatory system: Secondary | ICD-10-CM | POA: Diagnosis not present

## 2023-12-20 DIAGNOSIS — F330 Major depressive disorder, recurrent, mild: Secondary | ICD-10-CM | POA: Diagnosis present

## 2023-12-20 DIAGNOSIS — Z882 Allergy status to sulfonamides status: Secondary | ICD-10-CM | POA: Diagnosis not present

## 2023-12-20 DIAGNOSIS — Z885 Allergy status to narcotic agent status: Secondary | ICD-10-CM | POA: Diagnosis not present

## 2023-12-20 DIAGNOSIS — K5909 Other constipation: Secondary | ICD-10-CM | POA: Diagnosis present

## 2023-12-20 DIAGNOSIS — E785 Hyperlipidemia, unspecified: Secondary | ICD-10-CM | POA: Diagnosis present

## 2023-12-20 DIAGNOSIS — E66812 Obesity, class 2: Secondary | ICD-10-CM | POA: Diagnosis present

## 2023-12-20 DIAGNOSIS — K5732 Diverticulitis of large intestine without perforation or abscess without bleeding: Secondary | ICD-10-CM | POA: Diagnosis present

## 2023-12-20 DIAGNOSIS — Z79899 Other long term (current) drug therapy: Secondary | ICD-10-CM | POA: Diagnosis not present

## 2023-12-20 DIAGNOSIS — Z7985 Long-term (current) use of injectable non-insulin antidiabetic drugs: Secondary | ICD-10-CM | POA: Diagnosis not present

## 2023-12-20 DIAGNOSIS — K5792 Diverticulitis of intestine, part unspecified, without perforation or abscess without bleeding: Secondary | ICD-10-CM | POA: Diagnosis not present

## 2023-12-20 DIAGNOSIS — M1990 Unspecified osteoarthritis, unspecified site: Secondary | ICD-10-CM | POA: Diagnosis present

## 2023-12-20 LAB — BASIC METABOLIC PANEL WITH GFR
Anion gap: 9 (ref 5–15)
BUN: 22 mg/dL (ref 8–23)
CO2: 21 mmol/L — ABNORMAL LOW (ref 22–32)
Calcium: 7.8 mg/dL — ABNORMAL LOW (ref 8.9–10.3)
Chloride: 100 mmol/L (ref 98–111)
Creatinine, Ser: 1.35 mg/dL — ABNORMAL HIGH (ref 0.44–1.00)
GFR, Estimated: 41 mL/min — ABNORMAL LOW (ref >60)
Glucose, Bld: 80 mg/dL (ref 70–99)
Potassium: 4.5 mmol/L (ref 3.5–5.1)
Sodium: 133 mmol/L — ABNORMAL LOW (ref 135–145)

## 2023-12-20 LAB — CBC
HCT: 29.2 % — ABNORMAL LOW (ref 36.0–46.0)
Hemoglobin: 9.4 g/dL — ABNORMAL LOW (ref 12.0–15.0)
MCH: 31.9 pg (ref 26.0–34.0)
MCHC: 32.2 g/dL (ref 30.0–36.0)
MCV: 99 fL (ref 80.0–100.0)
Platelets: 189 K/uL (ref 150–400)
RBC: 2.95 MIL/uL — ABNORMAL LOW (ref 3.87–5.11)
RDW: 14.1 % (ref 11.5–15.5)
WBC: 5.6 K/uL (ref 4.0–10.5)
nRBC: 0 % (ref 0.0–0.2)

## 2023-12-20 MED ORDER — SODIUM CHLORIDE 0.9 % IV SOLN: INTRAVENOUS | Status: DC

## 2023-12-20 MED ORDER — ONDANSETRON HCL 4 MG/2ML IJ SOLN
4.0000 mg | INTRAMUSCULAR | Status: DC | PRN
  Administered 2023-12-21 – 2023-12-22 (×3): 4 mg via INTRAVENOUS
  Filled 2023-12-20 (×3): qty 2

## 2023-12-20 MED ORDER — SODIUM CHLORIDE 0.9 % IV SOLN
12.5000 mg | Freq: Once | INTRAVENOUS | Status: AC
  Administered 2023-12-20: 12.5 mg via INTRAVENOUS
  Filled 2023-12-20: qty 0.5
  Filled 2023-12-20: qty 12.5

## 2023-12-20 MED ORDER — TRAMADOL HCL 50 MG PO TABS
50.0000 mg | ORAL_TABLET | Freq: Four times a day (QID) | ORAL | Status: DC | PRN
  Administered 2023-12-21 – 2023-12-22 (×2): 50 mg via ORAL
  Filled 2023-12-20 (×3): qty 1

## 2023-12-20 MED ORDER — DIPHENHYDRAMINE HCL 25 MG PO CAPS
25.0000 mg | ORAL_CAPSULE | Freq: Four times a day (QID) | ORAL | Status: DC | PRN
  Administered 2023-12-20: 25 mg via ORAL
  Filled 2023-12-20: qty 1

## 2023-12-20 MED ORDER — MORPHINE SULFATE (PF) 2 MG/ML IV SOLN
2.0000 mg | INTRAVENOUS | Status: AC | PRN
  Administered 2023-12-20 – 2023-12-21 (×3): 2 mg via INTRAVENOUS
  Filled 2023-12-20 (×3): qty 1

## 2023-12-20 MED ORDER — ONDANSETRON HCL 4 MG PO TABS: 4.0000 mg | ORAL_TABLET | ORAL | Status: DC | PRN

## 2023-12-20 NOTE — Care Management Important Message (Signed)
 Important Message  Patient Details  Name: JEVAEH SHAMS MRN: [id number] Date of Birth: 07-Sep-1950   Important Message Given:        [name] 12/20/2023, 10:24 AM

## 2023-12-20 NOTE — Plan of Care (Signed)

## 2023-12-20 NOTE — Care Management Obs Status (Signed)
 MEDICARE OBSERVATION STATUS NOTIFICATION   Patient Details  Name: [name] MRN: [id number] Date of Birth: [birth year]   Medicare Observation Status Notification Given:  Yes    [name] 12/20/2023, 10:21 AM

## 2023-12-20 NOTE — Progress Notes (Signed)
 [facility]  - [place] [facility]   PATIENT NAME: [name]    MR#:  [id number]  DATE OF BIRTH:  07-06-1950  SUBJECTIVE:  complains of nausea. Patient did have vomiting times one. Complains of left sided abdominal pain. Willing to try clear liquid. Family at bedside. Patient tells me she was treated with amoxicillin  as outpatient earlier this year by Dr. Cleotilde Church:  Blood pressure (!) 165/61, pulse 64, temperature 98.4 F (36.9 C), temperature source Oral, resp. rate 16, height 5' 3 (1.6 m), weight 88.5 kg, SpO2 98%.  PHYSICAL EXAMINATION:   GENERAL:  73 y.o.-year-old patient with no acute distress. Obese LUNGS: Normal breath sounds bilaterally, no wheezing CARDIOVASCULAR: S1, S2 normal. No murmur   ABDOMEN: Soft, left lower quadrant tender  Bowel sounds present.  EXTREMITIES: No  edema b/l.    NEUROLOGIC: nonfocal  patient is alert and awake  LABORATORY PANEL:  CBC Recent Labs  Lab 12/20/23 0427  WBC 5.6  HGB 9.4*  HCT 29.2*  PLT 189    Chemistries  Recent Labs  Lab 12/19/23 1008 12/20/23 0427  NA 139 133*  K 4.3 4.5  CL 104 100  CO2 25 21*  GLUCOSE 103* 80  BUN 32* 22  CREATININE 1.19* 1.35*  CALCIUM 9.2 7.8*  AST 19  --   ALT 19  --   ALKPHOS 77  --   BILITOT 0.8  --     RADIOLOGY:  CT ABDOMEN PELVIS W CONTRAST Result Date: 12/19/2023 EXAM: CT ABDOMEN AND PELVIS WITH CONTRAST 12/19/2023 11:48:46 AM TECHNIQUE: CT of the abdomen and pelvis was performed with the administration of intravenous contrast (100mL iohexol  (OMNIPAQUE ) 300 MG/ML solution). Multiplanar reformatted images are provided for review. Automated exposure control, iterative reconstruction, and/or weight-based adjustment of the mA/kV was utilized to reduce the radiation dose to as low as reasonably achievable. COMPARISON: 07/08/2023 CLINICAL HISTORY: LLQ abdominal pain. LLQ pain FINDINGS: LOWER CHEST: Moderate-to-marked  right hemidiaphragm elevation. LIVER: The  liver is unremarkable. GALLBLADDER AND BILE DUCTS: Cholecystectomy. SPLEEN: No acute abnormality. PANCREAS: No acute abnormality. ADRENAL GLANDS: Bilateral adrenal thickening. KIDNEYS, URETERS AND BLADDER: No hydronephrosis. Right renal 1.0 cm low density lesion is likely a cyst. No follow-up indicated. No perinephric or periureteral stranding. Urinary bladder is unremarkable. GI AND BOWEL: Normal stomach, without wall thickening. Scattered colonic diverticula. Suspect minimal edema adjacent to proximal sigmoid including on image 71/2. No drainable fluid collection or free perforation. Normal terminal ileum. Appendix not visualized, but no right lower quadrant inflammation seen. Normal small bowel. PERITONEUM AND RETROPERITONEUM: No ascites. No free air. VASCULATURE: Aortic atherosclerosis. LYMPH NODES: No lymphadenopathy. REPRODUCTIVE ORGANS: Hysterectomy. BONES AND SOFT TISSUES: No acute osseous abnormality. Small fat-containing ventral abdominal pelvic wall hernia. IMPRESSION: 1. Extensive colonic diverticulosis with suspicion of mild proximal sigmoid diverticulitis. 2. No other explanation for patient's symptoms. 3. Aortic atherosclerosis (icd10-i70.0) Electronically signed by: Rockey Kilts MD 12/19/2023 12:21 PM EDT RP Workstation: [id number]    Assessment and Plan [name] is a 73 year old female with history of depression, anxiety, GERD, non-insulin-dependent diabetes mellitus, insomnia, hypertension, presents emergency department for chief concerns of abdominal pain.   CT abdomen  Extensive colonic diverticulosis with suspicion of mild proximal sigmoid diverticulitis.  Acute mild sigmoid diverticulitis extensive diverticulosis --Continue with Zosyn  per pharmacy -- IV pain meds PRN -- PRN antiemetics    Anxiety --PDMP reviewed by dr Sherre --Current prescription includes alprazolam  0.25 mg tablet, 30-day supply, filled on 10/30/2023 --Home alprazolam  0.25  mg daily as needed for anxiety resumed  on admission   Gastroesophageal reflux disease -- famotidine     Hypertension -- at home patient is on amlodipine,olmesartan /hydrochlorothiazide  -- currently on  olmesartan -hydrochlorothiazide     Procedures: Family communication :family at bedside Consults :None CODE STATUS:full  DVT Prophylaxis :lovenox Level of care: Telemetry Medical Status is: Inpatient Remains inpatient appropriate because: ongoing management of acute diverticulitis    TOTAL TIME TAKING CARE OF THIS PATIENT: 40 minutes.  >50% time spent on counselling and coordination of care  Note: This dictation was prepared with Dragon dictation along with smaller phrase technology. Any transcriptional errors that result from this process are unintentional.  [name] M.D    [facility]   CC: Primary care physician; Stephanie Oneil FALCON, MD

## 2023-12-20 NOTE — TOC CM/SW Note (Signed)
 Transition of Care [facility]) - Inpatient Brief Assessment   Patient Details  Name: [name] MRN: [id number] Date of Birth: 08/30/1950  Transition of Care [facility]) CM/SW Contact:    Corean ONEIDA Haddock, RN Phone Number: 12/20/2023, 12:28 PM   Clinical Narrative:    Transition of Care [facility]) Screening Note   Patient Details  Name: [name] Date of Birth: 1951/02/21   Transition of Care [facility]) CM/SW Contact:    Corean ONEIDA Haddock, RN Phone Number: 12/20/2023, 12:28 PM    Transition of Care Department [facility]) has reviewed patient and no TOC needs have been identified at this time. If new patient transition needs arise, please place a TOC consult.   Transition of Care Asessment: Insurance and Status: Insurance coverage has been reviewed Patient has primary care physician: Yes     Prior/Current Home Services: No current home services Social Drivers of Health Review: SDOH reviewed no interventions necessary Readmission risk has been reviewed: Yes Transition of care needs: no transition of care needs at this time

## 2023-12-21 DIAGNOSIS — K5792 Diverticulitis of intestine, part unspecified, without perforation or abscess without bleeding: Secondary | ICD-10-CM | POA: Diagnosis not present

## 2023-12-21 MED ORDER — MORPHINE SULFATE (PF) 2 MG/ML IV SOLN
2.0000 mg | Freq: Once | INTRAVENOUS | Status: AC
  Administered 2023-12-21: 2 mg via INTRAVENOUS
  Filled 2023-12-21: qty 1

## 2023-12-21 MED ORDER — POLYETHYLENE GLYCOL 3350 17 G PO PACK
17.0000 g | PACK | Freq: Two times a day (BID) | ORAL | Status: DC
  Administered 2023-12-21 – 2023-12-22 (×3): 17 g via ORAL
  Filled 2023-12-21 (×3): qty 1

## 2023-12-21 MED ORDER — PANTOPRAZOLE SODIUM 40 MG PO TBEC
40.0000 mg | DELAYED_RELEASE_TABLET | Freq: Every day | ORAL | Status: DC
  Administered 2023-12-21 – 2023-12-23 (×3): 40 mg via ORAL
  Filled 2023-12-21 (×3): qty 1

## 2023-12-21 MED ORDER — SENNOSIDES-DOCUSATE SODIUM 8.6-50 MG PO TABS
1.0000 | ORAL_TABLET | Freq: Two times a day (BID) | ORAL | Status: DC
  Administered 2023-12-21 – 2023-12-22 (×3): 1 via ORAL
  Filled 2023-12-21 (×3): qty 1

## 2023-12-21 NOTE — Progress Notes (Signed)
 [facility]  - [place] [facility]   PATIENT NAME: [name]    MR#:  [id number]  DATE OF BIRTH:  [birth year]  SUBJECTIVE:  complains of nausea. Tolerating liquid diet. She is interested in advancing to full liquid. No diarrhea. No vomiting. Discussed her challenges for constipation   VITALS:  Blood pressure (!) 157/61, pulse 67, temperature 97.8 F (36.6 C), temperature source Oral, resp. rate 17, height 5' 3 (1.6 m), weight 88.5 kg, SpO2 100%.  PHYSICAL EXAMINATION:   GENERAL:  73 y.o.-year-old patient with no acute distress. Obese LUNGS: Normal breath sounds bilaterally, no wheezing CARDIOVASCULAR: S1, S2 normal. No murmur   ABDOMEN: Soft, left lower quadrant tender  Bowel sounds present.  EXTREMITIES: No  edema b/l.    NEUROLOGIC: nonfocal  patient is alert and awake  LABORATORY PANEL:  CBC Recent Labs  Lab 12/20/23 0427  WBC 5.6  HGB 9.4*  HCT 29.2*  PLT 189    Chemistries  Recent Labs  Lab 12/19/23 1008 12/20/23 0427  NA 139 133*  K 4.3 4.5  CL 104 100  CO2 25 21*  GLUCOSE 103* 80  BUN 32* 22  CREATININE 1.19* 1.35*  CALCIUM 9.2 7.8*  AST 19  --   ALT 19  --   ALKPHOS 77  --   BILITOT 0.8  --     RADIOLOGY:  No results found.   Assessment and Plan [name] is a 73 year old female with history of depression, anxiety, GERD, non-insulin-dependent diabetes mellitus, insomnia, hypertension, presents emergency department for chief concerns of abdominal pain.   CT abdomen  Extensive colonic diverticulosis with suspicion of mild proximal sigmoid diverticulitis.  Acute mild sigmoid diverticulitis extensive diverticulosis --Continue with Zosyn  per pharmacy -- IV pain meds PRN -- PRN antiemetics slowly improving.-- Advance diet to full liquid. -- Mobility therapist to ambulate.-- Patient ambulated 160 feet    Anxiety --PDMP reviewed by dr Sherre --Current prescription includes alprazolam  0.25 mg tablet, 30-day supply, filled  on 10/30/2023 --Home alprazolam  0.25 mg daily as needed for anxiety resumed on admission   Gastroesophageal reflux disease -- famotidine     Hypertension -- at home patient is on amlodipine,olmesartan /hydrochlorothiazide  -- currently on  olmesartan -hydrochlorothiazide    Chronic constipation -- recommend patient to keep bowel regimen in her daily routine   Procedures: Family communication :family at bedside Consults :None CODE STATUS:full  DVT Prophylaxis :lovenox Level of care: Telemetry Medical Status is: Inpatient Remains inpatient appropriate because: ongoing management of acute diverticulitis   advance diet to soft today. If remains stable she can discharge tomorrow patient agreeable  TOTAL TIME TAKING CARE OF THIS PATIENT: 35 minutes.  >50% time spent on counselling and coordination of care  Note: This dictation was prepared with Dragon dictation along with smaller phrase technology. Any transcriptional errors that result from this process are unintentional.  Leita Blanch M.D    [facility]   CC: Primary care physician; Cleotilde Oneil FALCON, MD

## 2023-12-21 NOTE — Plan of Care (Signed)

## 2023-12-21 NOTE — Progress Notes (Signed)
 Mobility Specialist - Progress Note   12/21/23 1119  Mobility  Activity Ambulated with assistance  Level of Assistance Standby assist, set-up cues, supervision of patient - no hands on  Assistive Device  (pushinig IV pole)  Distance Ambulated (ft) 160 ft  Activity Response Tolerated well  Mobility visit 1 Mobility  Mobility Specialist Start Time (ACUTE ONLY) 1053  Mobility Specialist Stop Time (ACUTE ONLY) 1104  Mobility Specialist Time Calculation (min) (ACUTE ONLY) 11 min   Pt amb one lap around the NS, no AD, pushing IV pole for support w/ supervision--- tolerated well. Pt expressed feeling a little dizzy during amb, opting for a slower speed for safety. Pt returned to the room, left semi fowler with needs within reach.  America Silvan Mobility Specialist 12/21/23 11:23 AM

## 2023-12-22 DIAGNOSIS — K5792 Diverticulitis of intestine, part unspecified, without perforation or abscess without bleeding: Secondary | ICD-10-CM | POA: Diagnosis not present

## 2023-12-22 LAB — BASIC METABOLIC PANEL WITH GFR
Anion gap: 12 (ref 5–15)
BUN: 13 mg/dL (ref 8–23)
CO2: 22 mmol/L (ref 22–32)
Calcium: 8.9 mg/dL (ref 8.9–10.3)
Chloride: 104 mmol/L (ref 98–111)
Creatinine, Ser: 1.19 mg/dL — ABNORMAL HIGH (ref 0.44–1.00)
GFR, Estimated: 48 mL/min — ABNORMAL LOW (ref >60)
Glucose, Bld: 94 mg/dL (ref 70–99)
Potassium: 4.3 mmol/L (ref 3.5–5.1)
Sodium: 138 mmol/L (ref 135–145)

## 2023-12-22 NOTE — Progress Notes (Signed)
 Progress Note    [name]  FMW:[id number] DOB: Aug 07, 1950  DOA: 12/19/2023 PCP: Cleotilde Oneil FALCON, MD      Brief Narrative:    Medical records reviewed and are as summarized below:  [name] is a 73 y.o. female with medical history significant for depression, anxiety, GERD, non-insulin-dependent diabetes mellitus, insomnia, hypertension, who presented to the hospital with abdominal pain.  CT abdomen  Extensive colonic diverticulosis with suspicion of mild proximal sigmoid diverticulitis.      Assessment/Plan:   Principal Problem:   Diverticulitis Active Problems:   Inadequate pain control   Anxiety   Gastroesophageal reflux disease   Hyperlipidemia   Major depressive disorder, recurrent, mild (HCC)   Benign essential hypertension   Hypertension    Body mass index is 34.54 kg/m.  (Class II obesity)   Tensive diverticulosis with acute sigmoid diverticulitis: Continue IV Zosyn .  Analgesics as needed for pain.  Antiemetics as needed for nausea/vomiting.  Continue full liquid diet as tolerated.   Anxiety: Continue Xanax  as needed   Chronic constipation: She moved her bowels this morning.   Comorbidities hypertension, GERD, depression  Diet Order             Diet full liquid Room service appropriate? Yes; Fluid consistency: Thin  Diet effective now                                  Consultants: None  Procedures: None    Medications:    famotidine   20 mg Oral BID   heparin   5,000 Units Subcutaneous Q8H   pantoprazole   40 mg Oral Daily   polyethylene glycol  17 g Oral BID   senna-docusate  1 tablet Oral BID   Continuous Infusions:  piperacillin -tazobactam (ZOSYN )  IV 3.375 g (12/22/23 1317)     Anti-infectives (From admission, onward)    Start     Dose/Rate Route Frequency Ordered Stop   12/19/23 2200  piperacillin -tazobactam (ZOSYN ) IVPB 3.375 g  Status:  Discontinued        3.375 g 12.5 mL/hr over 240  Minutes Intravenous Every 8 hours 12/19/23 1422 12/19/23 1430   12/19/23 1445  piperacillin -tazobactam (ZOSYN ) IVPB 3.375 g        3.375 g 12.5 mL/hr over 240 Minutes Intravenous Every 8 hours 12/19/23 1430 12/26/23 1359   12/19/23 1400  piperacillin -tazobactam (ZOSYN ) IVPB 3.375 g  Status:  Discontinued        3.375 g 100 mL/hr over 30 Minutes Intravenous  Once 12/19/23 1354 12/19/23 1430              Family Communication/Anticipated D/C date and plan/Code Status   DVT prophylaxis: heparin  injection 5,000 Units Start: 12/19/23 2200 Place TED hose Start: 12/19/23 1408     Code Status: Full Code  Family Communication: None Disposition Plan: Plan to discharge home   Status is: Inpatient Remains inpatient appropriate because: Acute diverticulitis       Subjective:   Interval events noted.  She complains of felt bad after eating grits this morning.  She says she felt full to she only ate a little.  She moved her bowels twice this morning.  Her stools were watery but nonbloody.  However, she does not have any nausea, vomiting or abdominal pain.  She said it is difficult to describe how she felt.  Objective:    Vitals:   12/21/23 2052 12/21/23 2053  12/22/23 0501 12/22/23 1442  BP: (!) 167/78 (!) 167/78 (!) 131/59 136/65  Pulse: (!) 58 74 (!) 54 64  Resp: 16 16 16 18   Temp: 98 F (36.7 C) 98 F (36.7 C) 97.9 F (36.6 C) 97.9 F (36.6 C)  TempSrc: Oral Oral Oral   SpO2: (!) 88%  97% 93%  Weight:      Height:       No data found.   Intake/Output Summary (Last 24 hours) at 12/22/2023 1609 Last data filed at 12/22/2023 0900 Gross per 24 hour  Intake 95 ml  Output --  Net 95 ml   Filed Weights   12/19/23 1854  Weight: 88.5 kg    Exam:  GEN: NAD SKIN: Warm and dry EYES: No pallor or icterus ENT: MMM CV: RRR PULM: CTA B ABD: soft, ND, left lower quadrant tenderness, no rebound tenderness or guarding, +BS CNS: AAO x 3, non focal EXT: No edema or  tenderness        Data Reviewed:   I have personally reviewed following labs and imaging studies:  Labs: Labs show the following:   Basic Metabolic Panel: Recent Labs  Lab 12/19/23 1008 12/20/23 0427 12/22/23 0903  NA 139 133* 138  K 4.3 4.5 4.3  CL 104 100 104  CO2 25 21* 22  GLUCOSE 103* 80 94  BUN 32* 22 13  CREATININE 1.19* 1.35* 1.19*  CALCIUM 9.2 7.8* 8.9   GFR Estimated Creatinine Clearance: 44.4 mL/min (A) (by C-G formula based on SCr of 1.19 mg/dL (H)). Liver Function Tests: Recent Labs  Lab 12/19/23 1008  AST 19  ALT 19  ALKPHOS 77  BILITOT 0.8  PROT 6.6  ALBUMIN 3.7   Recent Labs  Lab 12/19/23 1008  LIPASE 29   No results for input(s): AMMONIA in the last 168 hours. Coagulation profile No results for input(s): INR, PROTIME in the last 168 hours.  CBC: Recent Labs  Lab 12/19/23 1008 12/20/23 0427  WBC 7.4 5.6  HGB 11.4* 9.4*  HCT 35.9* 29.2*  MCV 96.5 99.0  PLT 218 189   Cardiac Enzymes: No results for input(s): CKTOTAL, CKMB, CKMBINDEX, TROPONINI in the last 168 hours. BNP (last 3 results) No results for input(s): PROBNP in the last 8760 hours. CBG: No results for input(s): GLUCAP in the last 168 hours. D-Dimer: No results for input(s): DDIMER in the last 72 hours. Hgb A1c: No results for input(s): HGBA1C in the last 72 hours. Lipid Profile: No results for input(s): CHOL, HDL, LDLCALC, TRIG, CHOLHDL, LDLDIRECT in the last 72 hours. Thyroid function studies: No results for input(s): TSH, T4TOTAL, T3FREE, THYROIDAB in the last 72 hours.  Invalid input(s): FREET3 Anemia work up: No results for input(s): VITAMINB12, FOLATE, FERRITIN, TIBC, IRON, RETICCTPCT in the last 72 hours. Sepsis Labs: Recent Labs  Lab 12/19/23 1008 12/20/23 0427  WBC 7.4 5.6    Microbiology No results found for this or any previous visit (from the past 240 hours).  Procedures and diagnostic  studies:  No results found.             LOS: 2 days   Leander Tout  [facility]   Pager on [email or web address]. If 7PM-7AM, please contact night-coverage at [email or web address]     12/22/2023, 4:09 PM

## 2023-12-22 NOTE — Care Management Important Message (Signed)
 Important Message  Patient Details  Name: [name] MRN: [id number] Date of Birth: 09/23/1950   Important Message Given:  Yes - Medicare IM     [name] 12/22/2023, 1:29 PM

## 2023-12-22 NOTE — Progress Notes (Signed)
 Mobility Specialist - Progress Note   12/22/23 1130  Mobility  Activity Ambulated independently  Level of Assistance Modified independent, requires aide device or extra time  Assistive Device None (pushing IV pole, utilized for BUE support)  Distance Ambulated (ft) 320 ft  Activity Response Tolerated well  Mobility visit 1 Mobility  Mobility Specialist Start Time (ACUTE ONLY) 1023  Mobility Specialist Stop Time (ACUTE ONLY) 1036  Mobility Specialist Time Calculation (min) (ACUTE ONLY) 13 min   Pt amb two laps around the NS ModI, utilizing IV pole for support-- limping noted. Pt endorsed R foot pain (top), expressed that pain started yesterday evening with pain level increasing during amb. Pt returned to the room, left supine with needs within reach.  America Silvan Mobility Specialist 12/22/23 11:40 AM

## 2023-12-23 DIAGNOSIS — K5792 Diverticulitis of intestine, part unspecified, without perforation or abscess without bleeding: Secondary | ICD-10-CM | POA: Diagnosis not present

## 2023-12-23 MED ORDER — METRONIDAZOLE 375 MG PO CAPS: 375.0000 mg | ORAL_CAPSULE | Freq: Two times a day (BID) | ORAL | 0 refills | Status: AC

## 2023-12-23 MED ORDER — CIPROFLOXACIN HCL 500 MG PO TABS: 500.0000 mg | ORAL_TABLET | Freq: Two times a day (BID) | ORAL | 0 refills | Status: AC

## 2023-12-23 NOTE — Progress Notes (Signed)
 Discharge instructions given to patient, questions answered. IV removed without complications. Patient transporting home via car by her friend. Patient will be in discharge lounge until patients ride arrives.

## 2023-12-23 NOTE — Progress Notes (Signed)
 Mobility Specialist - Progress Note   12/23/23 0920  Mobility  Activity Ambulated independently  Level of Assistance Independent  Assistive Device None (pushing IV pole)  Distance Ambulated (ft) 20 ft  Activity Response Tolerated well  Mobility visit 1 Mobility  Mobility Specialist Start Time (ACUTE ONLY) 0910  Mobility Specialist Stop Time (ACUTE ONLY) [id number]  Mobility Specialist Time Calculation (min) (ACUTE ONLY) 8 min   Pt amb around the room indep, tolerated well-- denies foot pain this date. Pt denies amb within the hallway d/t appearance. Pt left supine with needs within reach.  America Silvan Mobility Specialist 12/23/23 9:28 AM

## 2023-12-23 NOTE — Discharge Summary (Signed)
 Physician Discharge Summary   Patient: [name] MRN: [id number] DOB: 10-Sep-1950  Admit date:     12/19/2023  Discharge date: 12/23/23  Discharge Physician: [name]   PCP: Stephanie Oneil FALCON, MD   Recommendations at discharge:   Follow-up with PCP in 1 week Follow-up with Dr. Maryruth, gastroenterologist, in 4 weeks.    Discharge Diagnoses: Principal Problem:   Diverticulitis Active Problems:   Inadequate pain control   Anxiety   Gastroesophageal reflux disease   Hyperlipidemia   Major depressive disorder, recurrent, mild (HCC)   Benign essential hypertension   Hypertension  Resolved Problems:   Generalized anxiety disorder  Hospital Course:  [name] is a 73 y.o. female with medical history significant for depression, anxiety, GERD, non-insulin-dependent diabetes mellitus, insomnia, hypertension, who presented to the hospital with abdominal pain.   CT abdomen  Extensive colonic diverticulosis with suspicion of mild proximal sigmoid diverticulitis.     Assessment and Plan:   Extensive diverticulosis with acute sigmoid diverticulitis: Improved.  She tolerated soft diet.  She will be discharged on Ciprofloxacin  and Flagyl  for 10 days to complete 14-day course of treatment.   Outpatient follow-up with gastroenterologist recommended because of recurrent diverticulitis.   Anxiety: Continue Xanax  as needed     History of chronic intermittent constipation and diarrhea: She said sometimes she uses Linzess  as needed for constipation.  She said she has had altered bowel habits since she had diverticulitis over a year ago.     Comorbidities hypertension, GERD, depression            Consultants: None Procedures performed: None Disposition: Home Diet recommendation:  Discharge Diet Orders (From admission, onward)     Start     Ordered   12/23/23 0000  Diet - low sodium heart healthy        12/23/23 0935           Cardiac diet DISCHARGE  MEDICATION: Allergies as of 12/23/2023       Reactions   Aspirin Swelling   eyes Other Reaction(s): makes eyes swell and puff up and gets welts., Not available aluminum aspirin   Silicone Other (See Comments)   Codeine Swelling   Other Reaction(s): causes hallucinations   Allevyn Adhesive [wound Dressings] Other (See Comments)   Other reaction(s): Other (See Comments) Band-aids ok for short-term   Norco [hydrocodone-acetaminophen ] Itching   Percocet [oxycodone -acetaminophen ] Hives   Adhesive [tape] Rash   blisters   Sulfa Antibiotics Rash   blisters blisters   Wound Dressing Adhesive Rash   Other reaction(s): Other (See Comments) blisters        Medication List     STOP taking these medications    metoCLOPramide  10 MG tablet Commonly known as: REGLAN    ondansetron  4 MG tablet Commonly known as: ZOFRAN        TAKE these medications    acetaminophen  650 MG CR tablet Commonly known as: TYLENOL  Take 650 mg by mouth every 8 (eight) hours as needed for pain.   ALPRAZolam  0.25 MG tablet Commonly known as: XANAX  Take 1 tablet by mouth daily as needed.   amLODipine 5 MG tablet Commonly known as: NORVASC Take 5 mg by mouth daily.   ciprofloxacin  500 MG tablet Commonly known as: Cipro  Take 1 tablet (500 mg total) by mouth 2 (two) times daily for 10 days.   cyanocobalamin 1000 MCG/ML injection Commonly known as: VITAMIN B12 Inject 1,000 mcg into the muscle every 30 (thirty) days.   Flonase Allergy Relief  50 MCG/ACT nasal spray Generic drug: fluticasone Place 1 spray into both nostrils daily.   hydrochlorothiazide  25 MG tablet Commonly known as: HYDRODIURIL  Take 25 mg by mouth daily.   Linzess  290 MCG Caps capsule Generic drug: linaclotide  TAKE 1 CAPSULE EVERY DAY BEFORE BREAKFAST   metronidazole  375 MG capsule Commonly known as: Flagyl  Take 1 capsule (375 mg total) by mouth 2 (two) times daily for 10 days.   naproxen 500 MG tablet Commonly known as:  NAPROSYN Take 500 mg by mouth 2 (two) times daily as needed for headache.   olmesartan -hydrochlorothiazide  40-12.5 MG tablet Commonly known as: BENICAR  HCT Take 1 tablet by mouth daily.   pantoprazole  40 MG tablet Commonly known as: PROTONIX  Take 40 mg by mouth daily.        Follow-up Information     Stephanie Ole DASEN, MD. Schedule an appointment as soon as possible for a visit in 1 month(s).   Specialty: Gastroenterology Why: recurrent diverticulitis Contact information: [address] [place] [address] [phone]                Discharge Exam: [name]   12/19/23 1854  Weight: 88.5 kg   GEN: NAD SKIN: Warm and dry EYES: No pallor  or icterus ENT: MMM CV: RRR PULM: CTA B ABD: soft, ND, NT, +BS CNS: AAO x 3, non focal EXT: No edema or tenderness   Condition at discharge: good  The results of significant diagnostics from this hospitalization (including imaging, microbiology, ancillary and laboratory) are listed below for reference.   Imaging Studies: CT ABDOMEN PELVIS W CONTRAST Result Date: 12/19/2023 EXAM: CT ABDOMEN AND PELVIS WITH CONTRAST 12/19/2023 11:48:46 AM TECHNIQUE: CT of the abdomen and pelvis was performed with the administration of intravenous contrast (100mL iohexol  (OMNIPAQUE ) 300 MG/ML solution). Multiplanar reformatted images are provided for review. Automated exposure control, iterative reconstruction, and/or weight-based adjustment of the mA/kV was utilized to reduce the radiation dose to as low as reasonably achievable. COMPARISON: 07/08/2023 CLINICAL HISTORY: LLQ abdominal pain. LLQ pain FINDINGS: LOWER CHEST: Moderate-to-marked  right hemidiaphragm elevation. LIVER: The liver is unremarkable. GALLBLADDER AND BILE DUCTS: Cholecystectomy. SPLEEN: No acute abnormality. PANCREAS: No acute abnormality. ADRENAL GLANDS: Bilateral adrenal thickening. KIDNEYS, URETERS AND BLADDER: No hydronephrosis. Right renal 1.0 cm low density  lesion is likely a cyst. No follow-up indicated. No perinephric or periureteral stranding. Urinary bladder is unremarkable. GI AND BOWEL: Normal stomach, without wall thickening. Scattered colonic diverticula. Suspect minimal edema adjacent to proximal sigmoid including on image 71/2. No drainable fluid collection or free perforation. Normal terminal ileum. Appendix not visualized, but no right lower quadrant inflammation seen. Normal small bowel. PERITONEUM AND RETROPERITONEUM: No ascites. No free air. VASCULATURE: Aortic atherosclerosis. LYMPH NODES: No lymphadenopathy. REPRODUCTIVE ORGANS: Hysterectomy. BONES AND SOFT TISSUES: No acute osseous abnormality. Small fat-containing ventral abdominal pelvic wall hernia. IMPRESSION: 1. Extensive colonic diverticulosis with suspicion of mild proximal sigmoid diverticulitis. 2. No other explanation for patient's symptoms. 3. Aortic atherosclerosis ([id number]) Electronically signed by: Rockey Kilts MD 12/19/2023 12:21 PM EDT RP Workstation: [id number]    Microbiology: Results for orders placed or performed during the hospital encounter of 09/26/19  SARS CORONAVIRUS 2 (TAT 6-24 HRS) Nasopharyngeal Nasopharyngeal Swab     Status: None   Collection Time: 09/26/19  8:12 AM   Specimen: Nasopharyngeal Swab  Result Value Ref Range Status   SARS Coronavirus 2 NEGATIVE NEGATIVE Final    Comment: (NOTE) SARS-CoV-2 target nucleic acids are NOT DETECTED.  The SARS-CoV-2 RNA is  generally detectable in upper and lower respiratory specimens during the acute phase of infection. Negative results do not preclude SARS-CoV-2 infection, do not rule out co-infections with other pathogens, and should not be used as the sole basis for treatment or other patient management decisions. Negative results must be combined with clinical observations, patient history, and epidemiological information. The expected result is Negative.  Fact Sheet for  Patients: HairSlick.no  Fact Sheet for Healthcare Providers: quierodirigir.com  This test is not yet approved or cleared by the [place]  FDA and  has been authorized for detection and/or diagnosis of SARS-CoV-2 by FDA under an Emergency Use Authorization (EUA). This EUA will remain  in effect (meaning this test can be used) for the duration of the COVID-19 declaration under Se ction 564(b)(1) of the Act, 21 U.S.C. section 360bbb-3(b)(1), unless the authorization is terminated or revoked sooner.  Performed at [facility], [address] [place], [place] [address]     Labs: CBC: Recent Labs  Lab 12/19/23 1008 12/20/23 0427  WBC 7.4 5.6  HGB 11.4* 9.4*  HCT 35.9* 29.2*  MCV 96.5 99.0  PLT 218 189   Basic Metabolic Panel: Recent Labs  Lab 12/19/23 1008 12/20/23 0427 12/22/23 0903  NA 139 133* 138  K 4.3 4.5 4.3  CL 104 100 104  CO2 25 21* 22  GLUCOSE 103* 80 94  BUN 32* 22 13  CREATININE 1.19* 1.35* 1.19*  CALCIUM 9.2 7.8* 8.9   Liver Function Tests: Recent Labs  Lab 12/19/23 1008  AST 19  ALT 19  ALKPHOS 77  BILITOT 0.8  PROT 6.6  ALBUMIN 3.7   CBG: No results for input(s): GLUCAP in the last 168 hours.  Discharge time spent: greater than 30 minutes.  Signed: AIDA CHO, MD [facility] 12/23/2023

## 2024-02-25 ENCOUNTER — Other Ambulatory Visit: Payer: Self-pay | Admitting: Gastroenterology

## 2024-02-25 DIAGNOSIS — R1032 Left lower quadrant pain: Secondary | ICD-10-CM

## 2024-02-25 DIAGNOSIS — Z8719 Personal history of other diseases of the digestive system: Secondary | ICD-10-CM

## 2024-02-29 ENCOUNTER — Ambulatory Visit
Admission: RE | Admit: 2024-02-29 | Discharge: 2024-02-29 | Disposition: A | Discharge status: Home / Self Care | Source: Ambulatory Visit | Attending: Gastroenterology | Admitting: Gastroenterology

## 2024-02-29 DIAGNOSIS — R1032 Left lower quadrant pain: Secondary | ICD-10-CM | POA: Insufficient documentation

## 2024-02-29 DIAGNOSIS — Z8719 Personal history of other diseases of the digestive system: Secondary | ICD-10-CM | POA: Insufficient documentation

## 2024-02-29 MED ORDER — IOHEXOL 300 MG/ML  SOLN
100.0000 mL | Freq: Once | INTRAMUSCULAR | Status: AC | PRN
  Administered 2024-02-29: 100 mL via INTRAVENOUS

## 2024-02-29 MED ORDER — IOHEXOL 9 MG/ML PO SOLN
500.0000 mL | ORAL | Status: AC
  Administered 2024-02-29: 500 mL via ORAL
